# Patient Record
Sex: Male | Born: 1960 | Race: Asian | Hispanic: No | Marital: Married | Smoking: Never smoker
Health system: Southern US, Community
[De-identification: ages and names within clinical notes are randomized; demographics above are authoritative.]

## PROBLEM LIST (undated history)

## (undated) DIAGNOSIS — I10 Essential (primary) hypertension: Secondary | ICD-10-CM

## (undated) DIAGNOSIS — K219 Gastro-esophageal reflux disease without esophagitis: Secondary | ICD-10-CM

## (undated) DIAGNOSIS — E7800 Pure hypercholesterolemia, unspecified: Secondary | ICD-10-CM

---

## 2018-06-21 ENCOUNTER — Ambulatory Visit (HOSPITAL_COMMUNITY)
Admission: EM | Admit: 2018-06-21 | Discharge: 2018-06-21 | Disposition: A | Discharge status: Home / Self Care | Payer: Self-pay | Attending: Emergency Medicine | Admitting: Emergency Medicine

## 2018-06-21 ENCOUNTER — Other Ambulatory Visit: Payer: Self-pay

## 2018-06-21 ENCOUNTER — Encounter (HOSPITAL_COMMUNITY): Payer: Self-pay | Admitting: Emergency Medicine

## 2018-06-21 DIAGNOSIS — R519 Headache, unspecified: Secondary | ICD-10-CM

## 2018-06-21 DIAGNOSIS — R51 Headache: Secondary | ICD-10-CM

## 2018-06-21 MED ORDER — KETOROLAC TROMETHAMINE 60 MG/2ML IM SOLN
60.0000 mg | Freq: Once | INTRAMUSCULAR | Status: AC
  Administered 2018-06-21: 12:00:00 60 mg via INTRAMUSCULAR

## 2018-06-21 MED ORDER — KETOROLAC TROMETHAMINE 60 MG/2ML IM SOLN
INTRAMUSCULAR | Status: AC
  Filled 2018-06-21: qty 2

## 2018-06-21 NOTE — ED Triage Notes (Signed)
Pt presents to [facility] for assessment of 2 months of headache, frontal, and new cough starting yesterday.

## 2018-06-21 NOTE — Discharge Instructions (Addendum)
Your headache is likely a muscular type headache. Based on your history and physical exam, there is no worrisome sign of any severe cause for your headache. Please read attached instruction sheet on headaches. As Tylenol has worked for your headaches in the past, it is okay to use Tylenol, 2 every 6-8 hours if needed for headache, but you need to still follow-up with PCP to reevaluate your chronic headaches. Today in urgent care, we gave you a shot of Toradol which is a strong pain medicine.  This should help your headache. If any severely worsening symptoms, you need to go to emergency room.

## 2018-06-21 NOTE — ED Provider Notes (Addendum)
[facility]    CSN: [id number] Arrival date & time: 06/21/18  1106     History   Chief Complaint Chief Complaint  Patient presents with  . Headache  His primary language is [place]. Interpreter here  HPI Jonathan Prince is a 58 y.o. male.   HPI His symptoms are vague, and a great deal of time was spent taking history and assessing patient. His only complaint is intermittent mid-frontal headache for the past 2 months.  Recalls no injury.  The headache can come and go and sometimes be mild or moderate or severe.  May resolve for several days and then return.  No inciting  cause that he knows of.  He will take Tylenol and the headache resolves. This headache started yesterday, mid frontal, hurts to touch.  Described it as severe yesterday.  Took Tylenol and that decrease the pain somewhat.  Currently headache is 5 out of 10.  This is not the worst headache of his life. No associated weakness or numbness or fever or chills or nausea or vomiting. Denies focal neurologic symptoms.  Denies vision change. I questioned him several times but he denies any respiratory symptoms.  No cough or chest congestion or chest pain or shortness of breath.  No syncope. Denies fever or chills or myalgias. No new rash. No recent foreign travel.  Carefully reviewed past medical history and he denies any past medical problems.  Denies taking any medications other than Tylenol as needed. Denies any drug allergies.  History reviewed. No pertinent past medical history. He denies any past medical history for chronic disease.  Denies history of diabetes or neurologic disease or cardiorespiratory disease. There are no active problems to display for this patient.   History reviewed. No pertinent surgical history.  He denies any surgical history.   Home Medications    Prior to Admission medications   Not on File   He denies any rx medications.    Family History History reviewed. No pertinent  family history.  Social History Social History   Tobacco Use  . Smoking status: Never Smoker  . Smokeless tobacco: Never Used  Substance Use Topics  . Alcohol use: Never    Frequency: Never  . Drug use: Never     Allergies   Patient has no known allergies.   Review of Systems Review of Systems  Constitutional: Negative for chills, diaphoresis, fatigue and fever.  HENT: Negative for congestion, rhinorrhea and sore throat.   Eyes: Negative.  Negative for photophobia and visual disturbance.  Respiratory: Negative for cough, chest tightness and shortness of breath.   Cardiovascular: Negative for chest pain, palpitations and leg swelling.  Gastrointestinal: Negative for abdominal pain, diarrhea, nausea and vomiting.  Genitourinary: Negative.   Musculoskeletal: Negative.  Negative for neck stiffness.  Skin: Negative for rash.  Neurological: Positive for headaches. Negative for dizziness, tremors, seizures, syncope, speech difficulty, weakness, light-headedness and numbness.  Hematological: Negative for adenopathy.  Psychiatric/Behavioral: Negative for confusion and hallucinations.  All other systems reviewed and are negative.  Pertinent items noted in HPI and remainder of comprehensive ROS otherwise negative.    Physical Exam Triage Vital Signs ED Triage Vitals  Enc Vitals Group     BP 06/21/18 1130 (!) 148/90     Pulse Rate 06/21/18 1130 86     Resp 06/21/18 1130 16     Temp 06/21/18 1130 97.6 F (36.4 C)     Temp Source 06/21/18 1130 Temporal     SpO2 06/21/18  1130 97 %     Weight --      Height --      Head Circumference --      Peak Flow --      Pain Score 06/21/18 1127 5     Pain Loc --      Pain Edu? --      Excl. in GC? --    No data found.  Updated Vital Signs BP (!) 148/90 (BP Location: Left Arm)   Pulse 86   Temp 97.6 F (36.4 C) (Temporal)   Resp 16   SpO2 97%   Visual Acuity Right Eye Distance:   Left Eye Distance:   Bilateral Distance:     Right Eye Near:  Grossly intact Left Eye Near:   Grossly intact Bilateral Near:   Grossly intact   Physical Exam Vitals signs reviewed.  Constitutional:      General: He is not in acute distress.    Appearance: He is well-developed. He is not ill-appearing, toxic-appearing or diaphoretic.     Comments: No acute distress  HENT:     Head: Normocephalic and atraumatic. No contusion.     Right Ear: Hearing, tympanic membrane and ear canal normal.     Left Ear: Hearing, tympanic membrane and ear canal normal.     Nose: Nose normal.     Mouth/Throat:     Mouth: Mucous membranes are moist.  Eyes:     General: No scleral icterus.    Extraocular Movements: Extraocular movements intact.     Right eye: Normal extraocular motion and no nystagmus.     Left eye: Normal extraocular motion and no nystagmus.     Conjunctiva/sclera: Conjunctivae normal.     Pupils: Pupils are equal, round, and reactive to light.     Comments: Fundi within normal limits bilaterally.  Discs sharp.  No hemorrhages or exudates.  No signs of papilledema. Visual fields grossly intact bilaterally. Near vision grossly intact each eye and bilaterally  Neck:     Musculoskeletal: Full passive range of motion without pain, normal range of motion and neck supple.     Meningeal: Brudzinski's sign and Kernig's sign absent.  Cardiovascular:     Rate and Rhythm: Normal rate and regular rhythm.  Pulmonary:     Effort: Pulmonary effort is normal. No respiratory distress.     Breath sounds: Normal breath sounds.  Abdominal:     General: There is no distension.     Palpations: Abdomen is soft.  Musculoskeletal:        General: No swelling or tenderness.  Skin:    General: Skin is warm and dry.     Capillary Refill: Capillary refill takes less than 2 seconds.     Findings: No rash.  Neurological:     Mental Status: He is alert and oriented to person, place, and time.     GCS: GCS eye subscore is 4. GCS verbal subscore is  5. GCS motor subscore is 6.     Cranial Nerves: No cranial nerve deficit or facial asymmetry.     Sensory: No sensory deficit.     Gait: Gait normal.     Deep Tendon Reflexes: Reflexes are normal and symmetric.  Psychiatric:        Speech: Speech normal.        Behavior: Behavior normal.        Thought Content: Thought content normal.      UC Treatments / Results  Labs (all  labs ordered are listed, but only abnormal results are displayed) Labs Reviewed - No data to display  EKG None  Radiology No results found.  Procedures Procedures (including critical care time)  Medications Ordered in UC Medications  ketorolac (TORADOL) injection 60 mg (60 mg Intramuscular Given 06/21/18 1218)    Initial Impression / Assessment and Plan / UC Course  I have reviewed the triage vital signs and the nursing notes.  Pertinent labs & imaging results that were available during my care of the patient were reviewed by me and considered in my medical decision making (see chart for details).  Clinical Course as of Jun 20 2299  Tue Jun 21, 2018  1212 Physical exam within normal limits.  No focal neurologic symptoms.  No evidence of any acute neurologic event.  After risk-benefit alternatives discussed, will give Toradol 60 mg IM stat   [DM]    Clinical Course User Index [DM] Lajean Manes, MD   Clinically, no evidence of acute intracranial abnormality.  Physical exam within normal limits. No clinical evidence of infection.  Likely has muscle contraction type headache. Final Clinical Impressions(s) / UC Diagnoses   Final diagnoses:  Frontal headache  Treatment options discussed, as well as risks, benefits, alternatives. Patient voiced understanding and agreement with the following plans:    Discharge Instructions     Your headache is likely a muscular type headache. Based on your history and physical exam, there is no worrisome sign of any severe cause for your headache. Please read  attached instruction sheet on headaches. As Tylenol has worked for your headaches in the past, it is okay to use Tylenol, 2 every 6-8 hours if needed for headache, but you need to still follow-up with PCP to reevaluate your chronic headaches. Today in urgent care, we gave you a shot of Toradol which is a strong pain medicine.  This should help your headache. If any severely worsening symptoms, you need to go to emergency room.     About 15 minutes after Toradol shot, he was reevaluated, and headache significantly improved.  ED Prescriptions    None     Controlled Substance Prescriptions [place] Controlled Substance Registry consulted? Not Applicable   Follow-up with your primary care doctor in 5-7 days if not improving, or sooner if symptoms become worse. Precautions discussed through interpreter Red flags discussed.-Emergency room if any red flag Questions invited and answered through interpreter. Patient voiced understanding and agreement.   Over 45 minutes spent, greater than 50% of the time spent for counseling and coordination of care.  Lajean Manes, MD 06/21/18 5284    Lajean Manes, MD 06/21/18 2306

## 2018-06-23 ENCOUNTER — Emergency Department (HOSPITAL_COMMUNITY)
Admission: EM | Admit: 2018-06-23 | Discharge: 2018-06-23 | Disposition: A | Discharge status: Home / Self Care | Payer: HRSA Program | Attending: Emergency Medicine | Admitting: Emergency Medicine

## 2018-06-23 ENCOUNTER — Emergency Department (HOSPITAL_COMMUNITY): Payer: HRSA Program

## 2018-06-23 ENCOUNTER — Encounter (HOSPITAL_COMMUNITY): Payer: Self-pay

## 2018-06-23 ENCOUNTER — Other Ambulatory Visit: Payer: Self-pay

## 2018-06-23 DIAGNOSIS — U071 COVID-19: Secondary | ICD-10-CM

## 2018-06-23 DIAGNOSIS — I10 Essential (primary) hypertension: Secondary | ICD-10-CM

## 2018-06-23 DIAGNOSIS — R5383 Other fatigue: Secondary | ICD-10-CM | POA: Diagnosis not present

## 2018-06-23 DIAGNOSIS — R05 Cough: Secondary | ICD-10-CM | POA: Diagnosis present

## 2018-06-23 LAB — SARS CORONAVIRUS 2 BY RT PCR (HOSPITAL ORDER, PERFORMED IN ~~LOC~~ HOSPITAL LAB): SARS Coronavirus 2: POSITIVE — AB

## 2018-06-23 MED ORDER — BENZONATATE 100 MG PO CAPS: 100.0000 mg | ORAL_CAPSULE | Freq: Three times a day (TID) | ORAL | 0 refills | Status: DC

## 2018-06-23 MED ORDER — HYDROCHLOROTHIAZIDE 25 MG PO TABS: 25.0000 mg | ORAL_TABLET | Freq: Every day | ORAL | 0 refills | Status: DC

## 2018-06-23 NOTE — ED Provider Notes (Addendum)
[facility] Provider Note   CSN: [id number] Arrival date & time: 06/23/18  1556    History   Chief Complaint Chief Complaint  Patient presents with  . Cough  . Fatigue    HPI Jonathan Prince is a 58 y.o. male.     The history is provided by the patient and a relative. The history is limited by a language barrier. A language interpreter was used.  Cough     58 year old [place] male presenting with covid-19 like symptoms.  Patient is able to speak in [place] for which I was able to communicate with.  History obtained through patient and through son who is at bedside.  Patient report for the past 10 days he has not been feeling well.  He has decreased appetite, recurrent frontal headache, congestion, nonproductive cough, throat irritation, decreased energy level, feeling fatigue and having subjective fever.  Her symptom has been waxing waning and has become progressively worse.  He was seen in the ED several days ago for the same complaint.  Subsequently discharged home and went to work but he felt that he is too tired to work.  He did admits to prior interaction with a family member who test positive for COVID-19.  He denies any recent travel.  He denies any significant shortness of breath or wheezing.  Does not complain of any nausea or vomiting or diarrhea chest pain or abdominal pain.  He does have history of hypertension currently not on any medication.  He has been taking Tylenol at home for his headache without adequate relief.  History reviewed. No pertinent past medical history.  There are no active problems to display for this patient.   History reviewed. No pertinent surgical history.      Home Medications    Prior to Admission medications   Not on File    Family History No family history on file.  Social History Social History   Tobacco Use  . Smoking status: Never Smoker  . Smokeless tobacco: Never Used  Substance Use  Topics  . Alcohol use: Never    Frequency: Never  . Drug use: Never     Allergies   Patient has no known allergies.   Review of Systems Review of Systems  Respiratory: Positive for cough.   All other systems reviewed and are negative.    Physical Exam Updated Vital Signs BP (!) 177/94   Pulse 78   Temp 97.6 F (36.4 C) (Oral)   Resp 16   Wt 72.6 kg   SpO2 99%   Physical Exam Vitals signs and nursing note reviewed.  Constitutional:      General: He is not in acute distress.    Appearance: He is well-developed.  HENT:     Head: Atraumatic.     Nose: Nose normal.     Mouth/Throat:     Mouth: Mucous membranes are moist.  Eyes:     Conjunctiva/sclera: Conjunctivae normal.  Neck:     Musculoskeletal: Normal range of motion and neck supple.  Cardiovascular:     Rate and Rhythm: Normal rate and regular rhythm.     Pulses: Normal pulses.     Heart sounds: Normal heart sounds.  Pulmonary:     Effort: Pulmonary effort is normal.     Breath sounds: Normal breath sounds. No wheezing, rhonchi or rales.  Abdominal:     Palpations: Abdomen is soft.     Tenderness: There is no abdominal tenderness.  Musculoskeletal: Normal  range of motion.     Comments: 5 out of 5 strength to all 4 extremities  Lymphadenopathy:     Cervical: No cervical adenopathy.  Skin:    Findings: No rash.  Neurological:     General: No focal deficit present.     Mental Status: He is alert.  Psychiatric:        Mood and Affect: Mood normal.      ED Treatments / Results  Labs (all labs ordered are listed, but only abnormal results are displayed) Labs Reviewed  SARS CORONAVIRUS 2 (HOSPITAL ORDER, PERFORMED IN [facility]) - Abnormal; Notable for the following components:      Result Value   SARS Coronavirus 2 POSITIVE (*)    All other components within normal limits    EKG None  Radiology Dg Chest Portable 1 View  Result Date: 06/23/2018 CLINICAL DATA:  58 year old male  with fever and cough. EXAM: PORTABLE CHEST 1 VIEW COMPARISON:  None. FINDINGS: The lungs are clear. There is no pleural effusion or pneumothorax. The cardiac silhouette is within normal limits. No acute osseous pathology. IMPRESSION: No active disease. Electronically Signed   By: Elgie Collard M.D.   On: 06/23/2018 19:10    Procedures Procedures (including critical care time)  Medications Ordered in ED Medications - No data to display   Initial Impression / Assessment and Plan / ED Course  I have reviewed the triage vital signs and the nursing notes.  Pertinent labs & imaging results that were available during my care of the patient were reviewed by me and considered in my medical decision making (see chart for details).  Clinical Course as of Jul 26 800  Tue Jul 26, 2018  1824 SARS Coronavirus 2(!): POSITIVE [DM]    Clinical Course User Index [DM] Jonathan Manes, MD       BP (!) 171/92   Pulse 76   Temp 98.3 F (36.8 C) (Oral)   Resp 19   Wt 72.6 kg   SpO2 98%    Final Clinical Impressions(s) / ED Diagnoses   Final diagnoses:  COVID-19 virus detected  Essential hypertension    ED Discharge Orders         Ordered    hydrochlorothiazide (HYDRODIURIL) 25 MG tablet  Daily     06/23/18 2228    benzonatate (TESSALON) 100 MG capsule  Every 8 hours     06/23/18 2228         Jonathan Prince was evaluated in Emergency Department on 06/23/2018 for the symptoms described in the history of present illness. He was evaluated in the context of the global COVID-19 pandemic, which necessitated consideration that the patient might be at risk for infection with the SARS-CoV-2 virus that causes COVID-19. Institutional protocols and algorithms that pertain to the evaluation of patients at risk for COVID-19 are in a state of rapid change based on information released by regulatory bodies including the CDC and federal and state organizations. These policies and algorithms were followed during  the patient's care in the ED.  Patient with symptoms suggestive of COVID-19.  He is otherwise well-appearing in no acute respiratory discomfort.  O2 sats at 99% on room air.  He is afebrile.  He is found to be hypertensive with blood pressure 177/94.  History of hypertension not on any blood pressure medication.  Will obtain COVID test.  Anticipate discharge with appropriate work note.  10:23 PM Chest x-ray at this time shows no acute finding however  his COVID-19 test is positive.  At this time patient does not require to be admitted to the hospital.  No hypoxia, he is well-appearing, his vital sign is very stable.  Will discharge home with symptomatic treatment which includes cough medication.  I will also start patient on some blood pressure medication as well.  Work note provided.  Appropriate self quarantine instruction given.  Return precaution discussed.  Patient voiced understanding and agrees with plan.   Fayrene Helperran, Kamaya Keckler, PA-C 06/23/18 2259    Pricilla LovelessGoldston, Scott, MD 06/30/18 0840    Fayrene Helperran, Calene Paradiso, PA-C 07/27/18 [id number]    Pricilla LovelessGoldston, Scott, MD 07/30/18 [phone]

## 2018-06-23 NOTE — ED Notes (Signed)
Date and time results received: 06/23/18 2205 (use smartphrase ".now" to insert current time)  Test: COVID-19 Critical Value: positive  Name of Provider Notified: Kelton Pillar  Orders Received? Or Actions Taken?: Orders Received - See Orders for details

## 2018-06-23 NOTE — ED Notes (Signed)
Pt and friend given and verbalized understanding of discharge instructions and need for follow up with pcp and quarantine at home. Told to return if s/s worsen. No further distress or questions at time of ambulation out of department

## 2018-06-23 NOTE — ED Notes (Signed)
Pt resting in bed. Swab done and walked to lab. Monitors applied and VSS. Greta Doom, PA at the bedside. Pt in NAD

## 2018-06-23 NOTE — ED Triage Notes (Addendum)
Pt arrives with housemate who provides translation. Pt was seen at [facility] on 6/2 and received meds. Pt has been coughing "a lot", has been fatigued, and has headache. Pt tried working yesterday, and was unable to. He also endorses a fever, although he does not have a thermometer. Pt also endorses sore throat.

## 2018-07-16 ENCOUNTER — Other Ambulatory Visit: Payer: Self-pay | Admitting: *Deleted

## 2018-07-16 DIAGNOSIS — Z20822 Contact with and (suspected) exposure to covid-19: Secondary | ICD-10-CM

## 2018-07-22 LAB — NOVEL CORONAVIRUS, NAA: SARS-CoV-2, NAA: NOT DETECTED

## 2018-07-25 ENCOUNTER — Telehealth: Payer: Self-pay | Admitting: *Deleted

## 2018-07-25 DIAGNOSIS — Z20822 Contact with and (suspected) exposure to covid-19: Secondary | ICD-10-CM

## 2018-07-25 NOTE — Telephone Encounter (Signed)
-----   Message from Elsie Stain, MD sent at 07/25/2018 11:57 AM EDT ----- Regarding: needs  repeat COVID test , needs a second negative test in order to return to work Needs COVID test this week   Call his son Quinn Plowman [phone] as pt is [place] and does not speak english  No insurance  DOB 2060-02-10

## 2018-07-25 NOTE — Telephone Encounter (Signed)
Spoke with patient's son Grayland Ormond, scheduled patient for COVID 19 test tomorrow at [facility] at 9:45 am.  Testing protocol reviewed with patient's son.

## 2018-07-26 ENCOUNTER — Other Ambulatory Visit: Payer: Self-pay

## 2018-07-26 DIAGNOSIS — Z20822 Contact with and (suspected) exposure to covid-19: Secondary | ICD-10-CM

## 2018-07-30 LAB — NOVEL CORONAVIRUS, NAA: SARS-CoV-2, NAA: NOT DETECTED

## 2019-04-15 ENCOUNTER — Ambulatory Visit: Payer: Self-pay | Attending: Internal Medicine

## 2019-04-15 DIAGNOSIS — Z23 Encounter for immunization: Secondary | ICD-10-CM

## 2019-04-15 NOTE — Progress Notes (Signed)
   Covid-19 Vaccination Clinic  Name:  Jonathan Prince    MRN: [id number] DOB: 11/14/1960  04/15/2019  [name] was observed post Covid-19 immunization for 15 minutes without incident. He was provided with Vaccine Information Sheet and instruction to access the V-Safe system.   [name] was instructed to call 911 with any severe reactions post vaccine: Marland Kitchen Difficulty breathing  . Swelling of face and throat  . A fast heartbeat  . A bad rash all over body  . Dizziness and weakness

## 2019-05-10 ENCOUNTER — Encounter (HOSPITAL_COMMUNITY): Payer: Self-pay

## 2019-05-10 ENCOUNTER — Ambulatory Visit (HOSPITAL_COMMUNITY)
Admission: EM | Admit: 2019-05-10 | Discharge: 2019-05-10 | Disposition: A | Discharge status: Home / Self Care | Payer: 59 | Attending: Emergency Medicine | Admitting: Emergency Medicine

## 2019-05-10 ENCOUNTER — Other Ambulatory Visit: Payer: Self-pay

## 2019-05-10 DIAGNOSIS — K219 Gastro-esophageal reflux disease without esophagitis: Secondary | ICD-10-CM | POA: Diagnosis present

## 2019-05-10 LAB — COMPREHENSIVE METABOLIC PANEL
ALT: 20 U/L (ref 0–44)
AST: 18 U/L (ref 15–41)
Albumin: 4 g/dL (ref 3.5–5.0)
Alkaline Phosphatase: 68 U/L (ref 38–126)
Anion gap: 8 (ref 5–15)
BUN: 7 mg/dL (ref 6–20)
CO2: 23 mmol/L (ref 22–32)
Calcium: 8.7 mg/dL — ABNORMAL LOW (ref 8.9–10.3)
Chloride: 106 mmol/L (ref 98–111)
Creatinine, Ser: 0.73 mg/dL (ref 0.61–1.24)
GFR calc Af Amer: >60 mL/min (ref >60)
GFR calc non Af Amer: >60 mL/min (ref >60)
Glucose, Bld: 75 mg/dL (ref 70–99)
Potassium: 3.9 mmol/L (ref 3.5–5.1)
Sodium: 137 mmol/L (ref 135–145)
Total Bilirubin: 0.9 mg/dL (ref 0.3–1.2)
Total Protein: 7.9 g/dL (ref 6.5–8.1)

## 2019-05-10 LAB — CBC
HCT: 47.4 % (ref 39.0–52.0)
Hemoglobin: 14.6 g/dL (ref 13.0–17.0)
MCH: 22.5 pg — ABNORMAL LOW (ref 26.0–34.0)
MCHC: 30.8 g/dL (ref 30.0–36.0)
MCV: 73.1 fL — ABNORMAL LOW (ref 80.0–100.0)
Platelets: 310 10*3/uL (ref 150–400)
RBC: 6.48 MIL/uL — ABNORMAL HIGH (ref 4.22–5.81)
RDW: 14.2 % (ref 11.5–15.5)
WBC: 7.7 10*3/uL (ref 4.0–10.5)
nRBC: 0 % (ref 0.0–0.2)

## 2019-05-10 MED ORDER — OMEPRAZOLE 20 MG PO CPDR: 20.0000 mg | DELAYED_RELEASE_CAPSULE | Freq: Two times a day (BID) | ORAL | 0 refills | Status: DC

## 2019-05-10 NOTE — Discharge Instructions (Addendum)
Starting you on a medication to help with your stomach and acid reflux Take the medication twice a day before meals preferably once in the morning and once before dinner.  You want to take this medication 30 to 60 minutes before a meal with a full glass of water Avoid spicy, greasy, fried foods, caffeine because this can be irritating to the stomach. Would also like for you to keep monitoring your blood pressure.  Decreased sodium or salt intake and increase water intake for rehydration. If you may be mildly dehydrated We are checking some blood work and I will call with any abnormal results. Contact given for follow-up with primary care for further management

## 2019-05-11 NOTE — ED Provider Notes (Signed)
[facility]    CSN: [id number] Arrival date & time: 05/10/19  2831      History   Chief Complaint Chief Complaint  Patient presents with  . Diarrhea  . Chest Pain    HPI Jonathan Prince is a 59 y.o. male.   Patient is a 59 year old man the presents today with epigastric pain, diarrhea.  This has been present, waxing waning over the past few days.  Has not taken any medication for his symptoms.  Reporting worse after eating and slight nausea.  Diarrhea is mostly watery. no vomiting.  No fever, body aches, chills.  No chest pain or shortness of breath.  Denies any bloody stools. No recent traveling. No hx of h pylori.  Does not smoke or drink any alcohol.  ROS per HPI      History reviewed. No pertinent past medical history.  There are no problems to display for this patient.   History reviewed. No pertinent surgical history.     Home Medications    Prior to Admission medications   Medication Sig Start Date End Date Taking? Authorizing Provider  acetaminophen (TYLENOL) 325 MG tablet Take 650 mg by mouth every 6 (six) hours as needed for fever or headache.    [provider]  omeprazole (PRILOSEC) 20 MG capsule Take 1 capsule (20 mg total) by mouth 2 (two) times daily before a meal. 05/10/19 06/09/19  Junious Ragone A, NP  hydrochlorothiazide (HYDRODIURIL) 25 MG tablet Take 1 tablet (25 mg total) by mouth daily. 06/23/18 05/10/19  Domenic Moras, PA-C    Family History History reviewed. No pertinent family history.  Social History Social History   Tobacco Use  . Smoking status: Never Smoker  . Smokeless tobacco: Never Used  Substance Use Topics  . Alcohol use: Never  . Drug use: Never     Allergies   Patient has no known allergies.   Review of Systems Review of Systems   Physical Exam Triage Vital Signs ED Triage Vitals  Enc Vitals Group     BP 05/10/19 0932 (!) 166/93     Pulse Rate 05/10/19 0932 61     Resp 05/10/19 0932 14     Temp  05/10/19 0932 98.2 F (36.8 C)     Temp Source 05/10/19 0932 Oral     SpO2 05/10/19 0932 99 %     Weight --      Height --      Head Circumference --      Peak Flow --      Pain Score 05/10/19 0930 7     Pain Loc --      Pain Edu? --      Excl. in [place]? --    No data found.  Updated Vital Signs BP (!) 160/87 (BP Location: Right Arm)   Pulse 63   Temp 98.6 F (37 C) (Oral)   Resp 18   SpO2 99%   Visual Acuity Right Eye Distance:   Left Eye Distance:   Bilateral Distance:    Right Eye Near:   Left Eye Near:    Bilateral Near:     Physical Exam Abdominal:     General: Abdomen is flat. Bowel sounds are normal. There is no distension.     Palpations: Abdomen is soft.     Tenderness: There is abdominal tenderness in the epigastric area. There is no right CVA tenderness, left CVA tenderness, guarding or rebound. Negative signs include Murphy's sign.  UC Treatments / Results  Labs (all labs ordered are listed, but only abnormal results are displayed) Labs Reviewed  CBC - Abnormal; Notable for the following components:      Result Value   RBC 6.48 (*)    MCV 73.1 (*)    MCH 22.5 (*)    All other components within normal limits  COMPREHENSIVE METABOLIC PANEL - Abnormal; Notable for the following components:   Calcium 8.7 (*)    All other components within normal limits    EKG   Radiology No results found.  Procedures ED EKG  Date/Time: 05/11/2019 2:27 PM Performed by: Janace Aris, NP Authorized by: Wieters, Bethel C, PA-C   Interpretation:    Interpretation: normal   Rate:    ECG rate assessment: normal   Rhythm:    Rhythm: sinus rhythm   Ectopy:    Ectopy: none   ST segments:    ST segments:  Non-specific Other findings:    Other findings: LVH     (including critical care time)  Medications Ordered in UC Medications - No data to display  Initial Impression / Assessment and Plan / UC Course  I have reviewed the triage vital signs  and the nursing notes.  Pertinent labs & imaging results that were available during my care of the patient were reviewed by me and considered in my medical decision making (see chart for details).     GERD Treating with omeprazole Diet precautions given Instructions given on how to take the medicine.  Blood work here today unremarkable. EKG not concerning,.  Does not appear to be dehydrated from the diarrhea. No electrolyte imbalance Vital signs stable he is nontoxic or ill-appearing. Recommended decrease sodium intake and keep monitoring blood pressures for elevated blood pressure reading Contact given for primary care follow-up for further management of blood pressure Final Clinical Impressions(s) / UC Diagnoses   Final diagnoses:  Gastroesophageal reflux disease, unspecified whether esophagitis present     Discharge Instructions     Starting you on a medication to help with your stomach and acid reflux Take the medication twice a day before meals preferably once in the morning and once before dinner.  You want to take this medication 30 to 60 minutes before a meal with a full glass of water Avoid spicy, greasy, fried foods, caffeine because this can be irritating to the stomach. Would also like for you to keep monitoring your blood pressure.  Decreased sodium or salt intake and increase water intake for rehydration. If you may be mildly dehydrated We are checking some blood work and I will call with any abnormal results. Contact given for follow-up with primary care for further management    ED Prescriptions    Medication Sig Dispense Auth. Provider   omeprazole (PRILOSEC) 20 MG capsule Take 1 capsule (20 mg total) by mouth 2 (two) times daily before a meal. 60 capsule Ustin Cruickshank A, NP     PDMP not reviewed this encounter.   Dahlia Byes A, NP 05/11/19 1428

## 2019-10-22 NOTE — Progress Notes (Signed)
    SUBJECTIVE:   CHIEF COMPLAINT / HPI:  Patient has not had a doctor prior to this.  He has lived in [place] for 3 years.  Prior to that he lived in [place].  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  He does not have any knee back or shoulder pain from this work.  Patient lives with his wife and son.  Hypertension: Patient states he was diagnosed on his initial arrival to [place] and given a blood pressure medication, but he has not been taking it.  Dry skin on hand: Patient has itching of the fingers and palm, specifically on the right hand.  He believes it is from the chemicals he is exposed to at work.  He wears gloves while working.  Pruritus does not extend beyond his hands.  PERTINENT  PMH / PSH: No prior surgeries.  No allergies.  Patient has not smoked or drank alcohol in over 25 years  OBJECTIVE:   BP (!) 168/84   Pulse 83   Ht 5\' 3"  (1.6 m)   Wt 132 lb 6.4 oz (60.1 kg)   SpO2 97%   BMI 23.45 kg/m   General: Alert elderly thin male.  Appears older than stated age but active.  Son and interpreter are accompanying him. HEENT: EOMI, PERRLA.  Moist oral mucosa. CV: Regular rhythm, no murmurs Pulmonary: Lungs clear to auscultation bilaterally Abdomen: Soft, nontender.  Normal bowel sounds Skin: Dry cracked skin on the dorsal aspect of the hands and fingers.  Thickened palmar surface, but no lesions or vesicles.  ASSESSMENT/PLAN:   Hypertension Diagnosed approximately 3 years ago.  Not taking medications.  Multiple past values in health record above 160 systolic.  We will start losartan today.  Get BMP.  Follow-up in 1 to 2 weeks for recheck of BMP and blood pressure.  Healthcare maintenance We will get A1c and lipid panel today  Contact dermatitis and eczema Patient has dry cracked skin on the hands and states he is exposed to unknown chemicals on his hands at work even though he wears gloves.  Pruritus may be  due to contact dermatitis or a dyshidrotic eczema.  Advised patient to use Vaseline or other ointment on the hands multiple times a day and prescribed hydrocortisone 2.5 ointment for up pruritus episodes but advised him not to use this daily.     , MD [facility]

## 2019-10-23 ENCOUNTER — Encounter: Payer: Self-pay | Admitting: Family Medicine

## 2019-10-23 ENCOUNTER — Other Ambulatory Visit: Payer: Self-pay

## 2019-10-23 ENCOUNTER — Ambulatory Visit (INDEPENDENT_AMBULATORY_CARE_PROVIDER_SITE_OTHER): Payer: BC Managed Care – PPO | Admitting: Family Medicine

## 2019-10-23 VITALS — BP 168/84 | HR 83 | Ht 63.0 in | Wt 132.4 lb

## 2019-10-23 DIAGNOSIS — I10 Essential (primary) hypertension: Secondary | ICD-10-CM

## 2019-10-23 DIAGNOSIS — L259 Unspecified contact dermatitis, unspecified cause: Secondary | ICD-10-CM | POA: Diagnosis not present

## 2019-10-23 DIAGNOSIS — Z0000 Encounter for general adult medical examination without abnormal findings: Secondary | ICD-10-CM | POA: Diagnosis not present

## 2019-10-23 LAB — POCT GLYCOSYLATED HEMOGLOBIN (HGB A1C): Hemoglobin A1C: 4.7 % (ref 4.0–5.6)

## 2019-10-23 MED ORDER — LOSARTAN POTASSIUM 25 MG PO TABS: 25.0000 mg | ORAL_TABLET | Freq: Every day | ORAL | 3 refills | Status: DC

## 2019-10-23 MED ORDER — HYDROCORTISONE 2.5 % EX OINT: TOPICAL_OINTMENT | Freq: Two times a day (BID) | CUTANEOUS | 3 refills | Status: DC

## 2019-10-23 NOTE — Assessment & Plan Note (Signed)
Patient has dry cracked skin on the hands and states he is exposed to unknown chemicals on his hands at work even though he wears gloves.  Pruritus may be due to contact dermatitis or a dyshidrotic eczema.  Advised patient to use Vaseline or other ointment on the hands multiple times a day and prescribed hydrocortisone 2.5 ointment for up pruritus episodes but advised him not to use this daily.

## 2019-10-23 NOTE — Patient Instructions (Signed)
It was nice to meet you today,  I have prescribed a blood pressure medicine for you.  I want you to take it once a day at night before bed.  If you have any dizziness, swelling of the face, or cough let us know right away.  For your blood pressure, you should avoid foods with a lot of salt or sodium in them.  Otherwise you should eat a well-balanced diet that includes a lot of vegetables, grains and lean meats like chicken or fish.  I have prescribed you a steroid ointment for your dry skin when it itches.  I would like you to also put on Vaseline or similar ointment multiple times a day to keep your skin from drying out.  I would like to see you back in 10 to 14 days to recheck your blood pressure and kidney function.  Have a great day,  Frederic Jericho, MD

## 2019-10-23 NOTE — Assessment & Plan Note (Signed)
Diagnosed approximately 3 years ago.  Not taking medications.  Multiple past values in health record above 160 systolic.  We will start losartan today.  Get BMP.  Follow-up in 1 to 2 weeks for recheck of BMP and blood pressure.

## 2019-10-23 NOTE — Assessment & Plan Note (Signed)
We will get A1c and lipid panel today

## 2019-10-24 LAB — LIPID PANEL
Chol/HDL Ratio: 4.4 ratio (ref 0.0–5.0)
Cholesterol, Total: 179 mg/dL (ref 100–199)
HDL: 41 mg/dL (ref >39)
LDL Chol Calc (NIH): 107 mg/dL — ABNORMAL HIGH (ref 0–99)
Triglycerides: 174 mg/dL — ABNORMAL HIGH (ref 0–149)
VLDL Cholesterol Cal: 31 mg/dL (ref 5–40)

## 2019-10-24 LAB — BASIC METABOLIC PANEL
BUN/Creatinine Ratio: 16 (ref 10–24)
BUN: 15 mg/dL (ref 8–27)
CO2: 21 mmol/L (ref 20–29)
Calcium: 9.5 mg/dL (ref 8.6–10.2)
Chloride: 104 mmol/L (ref 96–106)
Creatinine, Ser: 0.91 mg/dL (ref 0.76–1.27)
GFR calc Af Amer: 105 mL/min/{1.73_m2} (ref >59)
GFR calc non Af Amer: 91 mL/min/{1.73_m2} (ref >59)
Glucose: 87 mg/dL (ref 65–99)
Potassium: 4.1 mmol/L (ref 3.5–5.2)
Sodium: 140 mmol/L (ref 134–144)

## 2019-12-01 ENCOUNTER — Emergency Department (HOSPITAL_COMMUNITY)
Admission: EM | Admit: 2019-12-01 | Discharge: 2019-12-01 | Disposition: A | Discharge status: Home / Self Care | Payer: BC Managed Care – PPO | Attending: Emergency Medicine | Admitting: Emergency Medicine

## 2019-12-01 ENCOUNTER — Encounter (HOSPITAL_COMMUNITY): Payer: Self-pay | Admitting: Emergency Medicine

## 2019-12-01 ENCOUNTER — Ambulatory Visit (HOSPITAL_COMMUNITY)
Admission: EM | Admit: 2019-12-01 | Discharge: 2019-12-01 | Disposition: A | Discharge status: Home / Self Care | Payer: BC Managed Care – PPO | Attending: Family Medicine | Admitting: Family Medicine

## 2019-12-01 ENCOUNTER — Emergency Department (HOSPITAL_COMMUNITY): Payer: BC Managed Care – PPO

## 2019-12-01 ENCOUNTER — Other Ambulatory Visit: Payer: Self-pay

## 2019-12-01 DIAGNOSIS — I10 Essential (primary) hypertension: Secondary | ICD-10-CM | POA: Diagnosis not present

## 2019-12-01 DIAGNOSIS — R0789 Other chest pain: Secondary | ICD-10-CM | POA: Insufficient documentation

## 2019-12-01 DIAGNOSIS — Z79899 Other long term (current) drug therapy: Secondary | ICD-10-CM | POA: Insufficient documentation

## 2019-12-01 DIAGNOSIS — R519 Headache, unspecified: Secondary | ICD-10-CM

## 2019-12-01 DIAGNOSIS — J90 Pleural effusion, not elsewhere classified: Secondary | ICD-10-CM | POA: Diagnosis not present

## 2019-12-01 DIAGNOSIS — R079 Chest pain, unspecified: Secondary | ICD-10-CM

## 2019-12-01 DIAGNOSIS — Z8673 Personal history of transient ischemic attack (TIA), and cerebral infarction without residual deficits: Secondary | ICD-10-CM | POA: Diagnosis not present

## 2019-12-01 DIAGNOSIS — R42 Dizziness and giddiness: Secondary | ICD-10-CM

## 2019-12-01 DIAGNOSIS — Z20822 Contact with and (suspected) exposure to covid-19: Secondary | ICD-10-CM | POA: Insufficient documentation

## 2019-12-01 DIAGNOSIS — J9811 Atelectasis: Secondary | ICD-10-CM | POA: Diagnosis not present

## 2019-12-01 LAB — BASIC METABOLIC PANEL
Anion gap: 11 (ref 5–15)
BUN: 7 mg/dL — ABNORMAL LOW (ref 8–23)
CO2: 26 mmol/L (ref 22–32)
Calcium: 8.8 mg/dL — ABNORMAL LOW (ref 8.9–10.3)
Chloride: 103 mmol/L (ref 98–111)
Creatinine, Ser: 0.88 mg/dL (ref 0.61–1.24)
GFR, Estimated: >60 mL/min (ref >60)
Glucose, Bld: 67 mg/dL — ABNORMAL LOW (ref 70–99)
Potassium: 3.5 mmol/L (ref 3.5–5.1)
Sodium: 140 mmol/L (ref 135–145)

## 2019-12-01 LAB — CBC
HCT: 47.3 % (ref 39.0–52.0)
Hemoglobin: 14.3 g/dL (ref 13.0–17.0)
MCH: 22.6 pg — ABNORMAL LOW (ref 26.0–34.0)
MCHC: 30.2 g/dL (ref 30.0–36.0)
MCV: 74.6 fL — ABNORMAL LOW (ref 80.0–100.0)
Platelets: 326 10*3/uL (ref 150–400)
RBC: 6.34 MIL/uL — ABNORMAL HIGH (ref 4.22–5.81)
RDW: 14.5 % (ref 11.5–15.5)
WBC: 7.1 10*3/uL (ref 4.0–10.5)
nRBC: 0 % (ref 0.0–0.2)

## 2019-12-01 LAB — TROPONIN I (HIGH SENSITIVITY)
Troponin I (High Sensitivity): 5 ng/L (ref <18)
Troponin I (High Sensitivity): 4 ng/L (ref <18)

## 2019-12-01 LAB — CBG MONITORING, ED: Glucose-Capillary: 70 mg/dL (ref 70–99)

## 2019-12-01 LAB — HEPATIC FUNCTION PANEL
ALT: 17 U/L (ref 0–44)
AST: 20 U/L (ref 15–41)
Albumin: 3.7 g/dL (ref 3.5–5.0)
Alkaline Phosphatase: 61 U/L (ref 38–126)
Bilirubin, Direct: 0.1 mg/dL (ref 0.0–0.2)
Indirect Bilirubin: 0.7 mg/dL (ref 0.3–0.9)
Total Bilirubin: 0.8 mg/dL (ref 0.3–1.2)
Total Protein: 7.7 g/dL (ref 6.5–8.1)

## 2019-12-01 LAB — RESPIRATORY PANEL BY RT PCR (FLU A&B, COVID)
Influenza A by PCR: NEGATIVE
Influenza B by PCR: NEGATIVE
SARS Coronavirus 2 by RT PCR: NEGATIVE

## 2019-12-01 MED ORDER — LOSARTAN POTASSIUM 25 MG PO TABS: 25.0000 mg | ORAL_TABLET | Freq: Every day | ORAL | 0 refills | Status: DC

## 2019-12-01 MED ORDER — LOSARTAN POTASSIUM 50 MG PO TABS
25.0000 mg | ORAL_TABLET | Freq: Once | ORAL | Status: AC
  Administered 2019-12-01: 25 mg via ORAL
  Filled 2019-12-01: qty 1

## 2019-12-01 NOTE — ED Triage Notes (Addendum)
Pt c/o dizziness and significant frontal HA for approx 3 days. Pt states he had similar HA in past. Also c/o intermittent central CP, now 5/10 pain scale, tight in nature.  Also c/o lower right back pain for approx 2-3 months.   Has not taken HTN Rx for approx 1 week 2/2 running out of medication.   Denies n/v/d, SOB, diaphoresis, radiating pain to jaw or upper back, slurred speech, changes in vision, extremity weakness, ear pain/pressure or ringing. Took tylenol yesterday for HA w/o improvement.  Smile symmetrical, PERRLA, no arm drift, grips strong, left slightly weaker than right. EKG performed and given to K. Harris, NP who came in for BS eval and advised pt and interpreter for need to go to ED STAT for cardiac eval, potential imaging of head.

## 2019-12-01 NOTE — ED Provider Notes (Signed)
[facility]    CSN: [id number] Arrival date & time: 12/01/19  0340      History   Chief Complaint Chief Complaint  Patient presents with  . Dizziness  . Chest Pain    HPI Uchenna Saraceni is a 59 y.o. male.   HPI  Patient with limited medical history presents today accompanied by family complaining of midsternal chest pain, dizziness, headache pain.  Patiently recently established with a primary care provider and was placed on antihypertensive which he took until the prescription was complete and never refilled.  Since that time he has had intermittent chest pain, generalized fatigue and dizziness.  He has had no previous cardiovascular work-up.  Pain has been constant and unrelieved with any positional changes or rest.  He denies any shortness of breath.  No past medical history on file.  Patient Active Problem List   Diagnosis Date Noted  . Hypertension 10/23/2019  . Healthcare maintenance 10/23/2019  . Contact dermatitis and eczema 10/23/2019    No past surgical history on file.     Home Medications    Prior to Admission medications   Medication Sig Start Date End Date Taking? Authorizing Provider  acetaminophen (TYLENOL) 325 MG tablet Take 650 mg by mouth every 6 (six) hours as needed for fever or headache. Patient not taking: Reported on 10/23/2019    [provider]  hydrocortisone 2.5 % ointment Apply topically 2 (two) times daily. As needed for mild eczema.  Do not use for more than 1-2 weeks at a time. 10/23/19   Sandre Kitty, MD  losartan (COZAAR) 25 MG tablet Take 1 tablet (25 mg total) by mouth at bedtime. 10/23/19   Sandre Kitty, MD  omeprazole (PRILOSEC) 20 MG capsule Take 1 capsule (20 mg total) by mouth 2 (two) times daily before a meal. Patient not taking: Reported on 10/23/2019 05/10/19 06/09/19  Dahlia Byes A, NP  hydrochlorothiazide (HYDRODIURIL) 25 MG tablet Take 1 tablet (25 mg total) by mouth daily. 06/23/18 05/10/19  Fayrene Helper, PA-C     Family History No family history on file.  Social History Social History   Tobacco Use  . Smoking status: Never Smoker  . Smokeless tobacco: Never Used  Substance Use Topics  . Alcohol use: Never  . Drug use: Never     Allergies   Patient has no known allergies.   Review of Systems Review of Systems Pertinent negatives listed in HPI  Physical Exam Triage Vital Signs ED Triage Vitals [12/01/19 1009]  Enc Vitals Group     BP (!) 171/77     Pulse Rate 73     Resp 17     Temp 97.8 F (36.6 C)     Temp Source Oral     SpO2 100 %     Weight      Height      Head Circumference      Peak Flow      Pain Score 5     Pain Loc      Pain Edu?      Excl. in [place]?    No data found.  Updated Vital Signs BP (!) 171/77 (BP Location: Right Arm)   Pulse 73   Temp 97.8 F (36.6 C) (Oral)   Resp 17   SpO2 100%   Visual Acuity Right Eye Distance:   Left Eye Distance:   Bilateral Distance:    Right Eye Near:   Left Eye Near:  Bilateral Near:     Physical Exam Vitals and nursing note reviewed.  Constitutional:      Appearance: He is not toxic-appearing.  HENT:     Head: Normocephalic.  Cardiovascular:     Rate and Rhythm: Normal rate. Rhythm irregular.  Pulmonary:     Effort: Pulmonary effort is normal.     Breath sounds: Normal breath sounds.  Skin:    General: Skin is warm and dry.     Capillary Refill: Capillary refill takes less than 2 seconds.  Neurological:     Mental Status: He is alert.  Psychiatric:        Mood and Affect: Mood normal.        Behavior: Behavior normal.      UC Treatments / Results  Labs (all labs ordered are listed, but only abnormal results are displayed) Labs Reviewed - No data to display  EKG   Radiology DG Chest 2 View  Result Date: 12/01/2019 CLINICAL DATA:  Chest pain EXAM: CHEST - 2 VIEW COMPARISON:  06/23/2018 chest radiograph. FINDINGS: Hypoinflated lungs with linear bibasilar atelectasis. No pneumothorax  or pleural effusion. Cardiomediastinal silhouette within normal limits. No acute osseous abnormality. IMPRESSION: No focal airspace disease.  Bibasilar atelectasis. Electronically Signed   By: Stana Bunting M.D.   On: 12/01/2019 10:53    Procedures Procedures (including critical care time)  Medications Ordered in UC Medications - No data to display  Initial Impression / Assessment and Plan / UC Course  I have reviewed the triage vital signs and the nursing notes.  Pertinent labs & imaging results that were available during my care of the patient were reviewed by me and considered in my medical decision making (see chart for details).     Patient on exam, findings are reassuring however patient does have an irregularity of heart rhythm when auscultated also given his history of uncontrolled hypertension, recurrent headaches, chest pain and accelerated hypertension on arrival here today I do advise further work-up and evaluation in the setting of the ER with cardiac enzymes to rule out any acute ischemic cardiac involvement. Patient is accompanied today by family who will transport patient to the ER for further work-up and evaluation.  Final Clinical Impressions(s) / UC Diagnoses   Final diagnoses:  Chest pain, unspecified type  Dizziness and giddiness  Essential hypertension   Discharge Instructions   None    ED Prescriptions    None     PDMP not reviewed this encounter.   Bing Neighbors, FNP 12/02/19 1012

## 2019-12-01 NOTE — ED Triage Notes (Signed)
Pt arrives from urgent care with c/o chest pain, headache, dizziness and lower back pain. Reports back pain has been ongoing for the past few months and headache has been present x3 days.

## 2019-12-01 NOTE — Discharge Instructions (Addendum)
If you develop chest pain, shortness of breath, or weakness or have any other concerns please seek additional medical care and evaluation.  As we discussed today taking medicine for blood pressure does not fix the blood pressure.  You will have high blood pressure.  The medicine helps lower your blood pressure while you are taking it.  If you do not take your medicine then your blood pressure will go up again.

## 2019-12-01 NOTE — ED Provider Notes (Signed)
[facility] EMERGENCY [facility]MENT Provider Note   CSN: [id number] Arrival date & time: 12/01/19  1024     History No chief complaint on file.   Jonathan Prince is a 59 y.o. male with a past medical history of stroke in [address] in [place], hypertension, who presents today for evaluation of multiple complaints.  History is obtained through his son who interprets per patient's request.  His son also lives with him.    Patient had run out of his antihypertensive about a week ago.  They were under the impression that you take the medicine for a month and then that fixes the high blood pressure and he does not need to keep taking it.  He has an appointment with his primary care doctor on Monday.  He reports that since he ran out of his antihypertensives he has had a frontal headache.  He reports he had a stroke in [address] in [place], does not know what kind of stroke and does not appear he was taking antihypertensives after that.  He reports no vision changes.  The frontal headache has not moved.  He denies any weakness after his prior stroke and denies any current weakness.  His son does not feel like he is confused.  No difficulty walking.  He denies any current chest pain.    He reports that since he ran out of the blood pressure medication he has intermittently been having chest pain that lasts for half an hour.  No clear triggers.  He was having this intermittently however very infrequently while taking antihypertensives.  He currently has not had any chest pain today, last time he had any was last night.  He thinks it may be related to eating.    When his chest hurts he denies any other symptoms including nausea or vomiting, diaphoresis, radiation of pain, jaw pain or feeling presyncopal.  No leg swelling.  No history of DVT/PE.  HPI     History reviewed. No pertinent past medical history.  Patient Active Problem List   Diagnosis Date Noted  . Hypertension 10/23/2019  . Healthcare  maintenance 10/23/2019  . Contact dermatitis and eczema 10/23/2019    History reviewed. No pertinent surgical history.     No family history on file.  Social History   Tobacco Use  . Smoking status: Never Smoker  . Smokeless tobacco: Never Used  Substance Use Topics  . Alcohol use: Never  . Drug use: Never    Home Medications Prior to Admission medications   Medication Sig Start Date End Date Taking? Authorizing Provider  acetaminophen (TYLENOL) 325 MG tablet Take 650 mg by mouth every 6 (six) hours as needed for fever or headache.    Yes [provider]  hydrocortisone 2.5 % ointment Apply topically 2 (two) times daily. As needed for mild eczema.  Do not use for more than 1-2 weeks at a time. Patient not taking: Reported on 12/01/2019 10/23/19   Sandre Kittylson, Daniel K, MD  losartan (COZAAR) 25 MG tablet Take 1 tablet (25 mg total) by mouth at bedtime. 12/01/19   Cristina GongHammond, Thaila Bottoms W, PA-C  omeprazole (PRILOSEC) 20 MG capsule Take 1 capsule (20 mg total) by mouth 2 (two) times daily before a meal. Patient not taking: Reported on 10/23/2019 05/10/19 06/09/19  Dahlia ByesBast, Traci A, NP  hydrochlorothiazide (HYDRODIURIL) 25 MG tablet Take 1 tablet (25 mg total) by mouth daily. 06/23/18 05/10/19  Fayrene Helperran, Bowie, PA-C    Allergies    Patient has no  known allergies.  Review of Systems   Review of Systems  Constitutional: Negative for chills, diaphoresis, fatigue and fever.  HENT: Negative for congestion, sore throat and trouble swallowing.   Eyes: Negative for visual disturbance.  Respiratory: Negative for cough, chest tightness, shortness of breath and wheezing.   Cardiovascular: Positive for chest pain. Negative for palpitations and leg swelling.  Gastrointestinal: Negative for abdominal pain, diarrhea, nausea and vomiting.  Genitourinary: Negative for dysuria.  Musculoskeletal: Negative for back pain and neck pain.  Skin: Negative for color change and rash.  Neurological: Positive for  headaches. Negative for seizures, syncope, speech difficulty and weakness.  Psychiatric/Behavioral: Negative for confusion.  All other systems reviewed and are negative.   Physical Exam Updated Vital Signs BP (!) 171/85   Pulse 64   Temp 98.5 F (36.9 C) (Oral)   Resp 12   SpO2 100%   Physical Exam Vitals and nursing note reviewed.  Constitutional:      General: He is not in acute distress.    Appearance: He is well-developed.  HENT:     Head: Normocephalic and atraumatic.  Eyes:     Conjunctiva/sclera: Conjunctivae normal.  Cardiovascular:     Rate and Rhythm: Normal rate and regular rhythm.     Pulses: Normal pulses.     Heart sounds: Normal heart sounds. No murmur heard.   Pulmonary:     Effort: Pulmonary effort is normal. No respiratory distress.     Breath sounds: Normal breath sounds.  Abdominal:     Palpations: Abdomen is soft.     Tenderness: There is no abdominal tenderness.  Musculoskeletal:     Cervical back: Normal range of motion and neck supple.     Right lower leg: No edema.     Left lower leg: No edema.  Skin:    General: Skin is warm and dry.  Neurological:     General: No focal deficit present.     Mental Status: He is alert.     Motor: No weakness.     Comments: Patient is awake and alert. Speech is not slurred.  He moves all 4 extremities spontaneously . No weakness in arms or legs.  No pronator drift.  Smile is symmetric.  Pupils PEARL  Psychiatric:        Mood and Affect: Mood normal.        Behavior: Behavior normal.     ED Results / Procedures / Treatments   Labs (all labs ordered are listed, but only abnormal results are displayed) Labs Reviewed  BASIC METABOLIC PANEL - Abnormal; Notable for the following components:      Result Value   Glucose, Bld 67 (*)    BUN 7 (*)    Calcium 8.8 (*)    All other components within normal limits  CBC - Abnormal; Notable for the following components:   RBC 6.34 (*)    MCV 74.6 (*)    MCH 22.6  (*)    All other components within normal limits  RESPIRATORY PANEL BY RT PCR (FLU A&B, COVID)  HEPATIC FUNCTION PANEL  CBG MONITORING, ED  TROPONIN I (HIGH SENSITIVITY)  TROPONIN I (HIGH SENSITIVITY)    EKG EKG Interpretation  Date/Time:  Friday December 01 2019 10:26:23 EST Ventricular Rate:  74 PR Interval:  154 QRS Duration: 94 QT Interval:  392 QTC Calculation: 435 R Axis:   62 Text Interpretation: Sinus rhythm with occasional Premature ventricular complexes Otherwise normal ECG No significant change since last  tracing Confirmed by Richardean Canal [id number]) on 12/01/2019 5:49:42 PM   Radiology DG Chest 2 View  Result Date: 12/01/2019 CLINICAL DATA:  Chest pain EXAM: CHEST - 2 VIEW COMPARISON:  06/23/2018 chest radiograph. FINDINGS: Hypoinflated lungs with linear bibasilar atelectasis. No pneumothorax or pleural effusion. Cardiomediastinal silhouette within normal limits. No acute osseous abnormality. IMPRESSION: No focal airspace disease.  Bibasilar atelectasis. Electronically Signed   By: Stana Bunting M.D.   On: 12/01/2019 10:53   CT Head Wo Contrast  Result Date: 12/01/2019 CLINICAL DATA:  59 year old male with headache. EXAM: CT HEAD WITHOUT CONTRAST TECHNIQUE: Contiguous axial images were obtained from the base of the skull through the vertex without intravenous contrast. COMPARISON:  None. FINDINGS: Brain: The ventricles and sulci appropriate size for patient's age. Mild periventricular and deep white matter chronic microvascular ischemic changes noted. There is a 3.8 x 1.2 cm area of old infarct and encephalomalacia involving the left basal ganglia. There is no acute intracranial hemorrhage. No mass effect or midline shift. No extra-axial fluid collection. Vascular: No hyperdense vessel or unexpected calcification. Skull: Normal. Negative for fracture or focal lesion. Sinuses/Orbits: There is diffuse mucoperiosteal thickening of paranasal sinuses. There is partial  opacification of the right sphenoid sinus. The mastoid air cells are clear. Other: None IMPRESSION: 1. No acute intracranial pathology. 2. 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 3. Paranasal sinus disease. Electronically Signed   By: Elgie Collard M.D.   On: 12/01/2019 17:28    Procedures Procedures (including critical care time)  Medications Ordered in ED Medications  losartan (COZAAR) tablet 25 mg (25 mg Oral Given 12/01/19 1449)    ED Course  I have reviewed the triage vital signs and the nursing notes.  Pertinent labs & imaging results that were available during my care of the patient were reviewed by me and considered in my medical decision making (see chart for details).  Clinical Course as of Nov 30 2200  Caleen Essex Dec 01, 2019  1730 Patient reevaluated.  His blood pressure is slightly improved and he reports that overall he feels better.  He remains neurovascularly intact on my exam.   [EH]    Clinical Course User Index [EH] Norman Clay   MDM Rules/Calculators/A&P                         Patient is a 59 year old [place]ard [place] speaking male who presents today with his son, who he requests interprets, for evaluation of multiple complaints. He ran out of his blood pressure medication about a week ago and since then has had frontal headache.  He additionally has increased frequency of intermittent chest pain.  1: Headache: Patient is neurovascularly intact on my exam.  He does have a history of prior stroke in 2015 reportedly in [place] however further details are unknown.  CT head was obtained without acute abnormality.  Given that his headache has been going on for multiple days I would expect to see an abnormality if he was having stroke.  Additionally he is neurovascularly intact on my exam decreasing my suspicion for CVA.    2: Chest pain: He reports intermittent chest pains.  He has not had any today.  These are more frequent since he ran out of  his blood pressure medication.  EKG is nonischemic.  Chest x-ray without mediastinal widening, consolidation pneumothorax or other cause of his symptoms.  He does not have shortness of breath, nausea vomiting or diaphoresis  with these symptoms and they last for half an hour.  Troponin x2 is not elevated.  He and his son seem to believe this is related to eating.  Unstable angina is considered, however it appears that this is a more long-term chronic issue.  Recommended outpatient follow-up, already has an appointment scheduled with PCP on Monday.  Discussed that if his symptoms return he needs to return to the emergency room.  3: Hypertension: It appears that patient and his son were under the impression that by taking a month of the blood pressure medicine it would fix and resolve the underlying hypertension.  I discussed with them at length that antihypertensives are typically a lifelong treatment, that they will help control the blood pressure however if he stops taking them his blood pressure will elevate.  Patient was given his home p.o. antihypertensives here, after which he felt better despite not having a significant change in his blood pressure.  Discussed DASH diet additionally.  He has an appointment with PCP on Monday, given return precautions and rx for a week of his losartan.   Return precautions were discussed with patient who states their understanding.  At the time of discharge patient denied any unaddressed complaints or concerns.  Patient is agreeable for discharge home.  Note: Portions of this report may have been transcribed using voice recognition software. Every effort was made to ensure accuracy; however, inadvertent computerized transcription errors may be present   Final Clinical Impression(s) / ED Diagnoses Final diagnoses:  Hypertension, unspecified type  Acute nonintractable headache, unspecified headache type    Rx / DC Orders ED Discharge Orders         Ordered     losartan (COZAAR) 25 MG tablet  Daily at bedtime        12/01/19 1747           Norman Clay 12/01/19 2208    Sabino Donovan, MD 12/04/19 1501

## 2019-12-01 NOTE — ED Notes (Signed)
Patient is being discharged from the Urgent Care and sent to the Emergency Department via POV. Per K. Harris, NP, patient is in need of higher level of care due to CP, HA, dizziness, HTN Patient is aware and verbalizes understanding of plan of care.  Vitals:   12/01/19 1009  BP: (!) 171/77  Pulse: 73  Resp: 17  Temp: 97.8 F (36.6 C)  SpO2: 100%

## 2019-12-01 NOTE — ED Notes (Signed)
Patient verbalizes understanding of discharge instructions. Opportunity for questioning and answers were provided. Pt discharged from ED ambulatory.  

## 2019-12-04 ENCOUNTER — Ambulatory Visit: Payer: BC Managed Care – PPO | Admitting: Family Medicine

## 2019-12-23 ENCOUNTER — Other Ambulatory Visit: Payer: Self-pay | Admitting: Family Medicine

## 2019-12-25 NOTE — Telephone Encounter (Signed)
Pt was supposed to follow up with me after starting BP medication last time.  Can we call him to schedule another appointment?

## 2020-01-11 ENCOUNTER — Ambulatory Visit: Payer: BC Managed Care – PPO | Admitting: Family Medicine

## 2021-03-24 IMAGING — DX PORTABLE CHEST - 1 VIEW
1 series · 1 of 1 positions shown · non-contrast
Comparison: None.

CLINICAL DATA: 57-year-old male with fever and cough.

EXAM:
PORTABLE CHEST 1 VIEW

[chest ap]
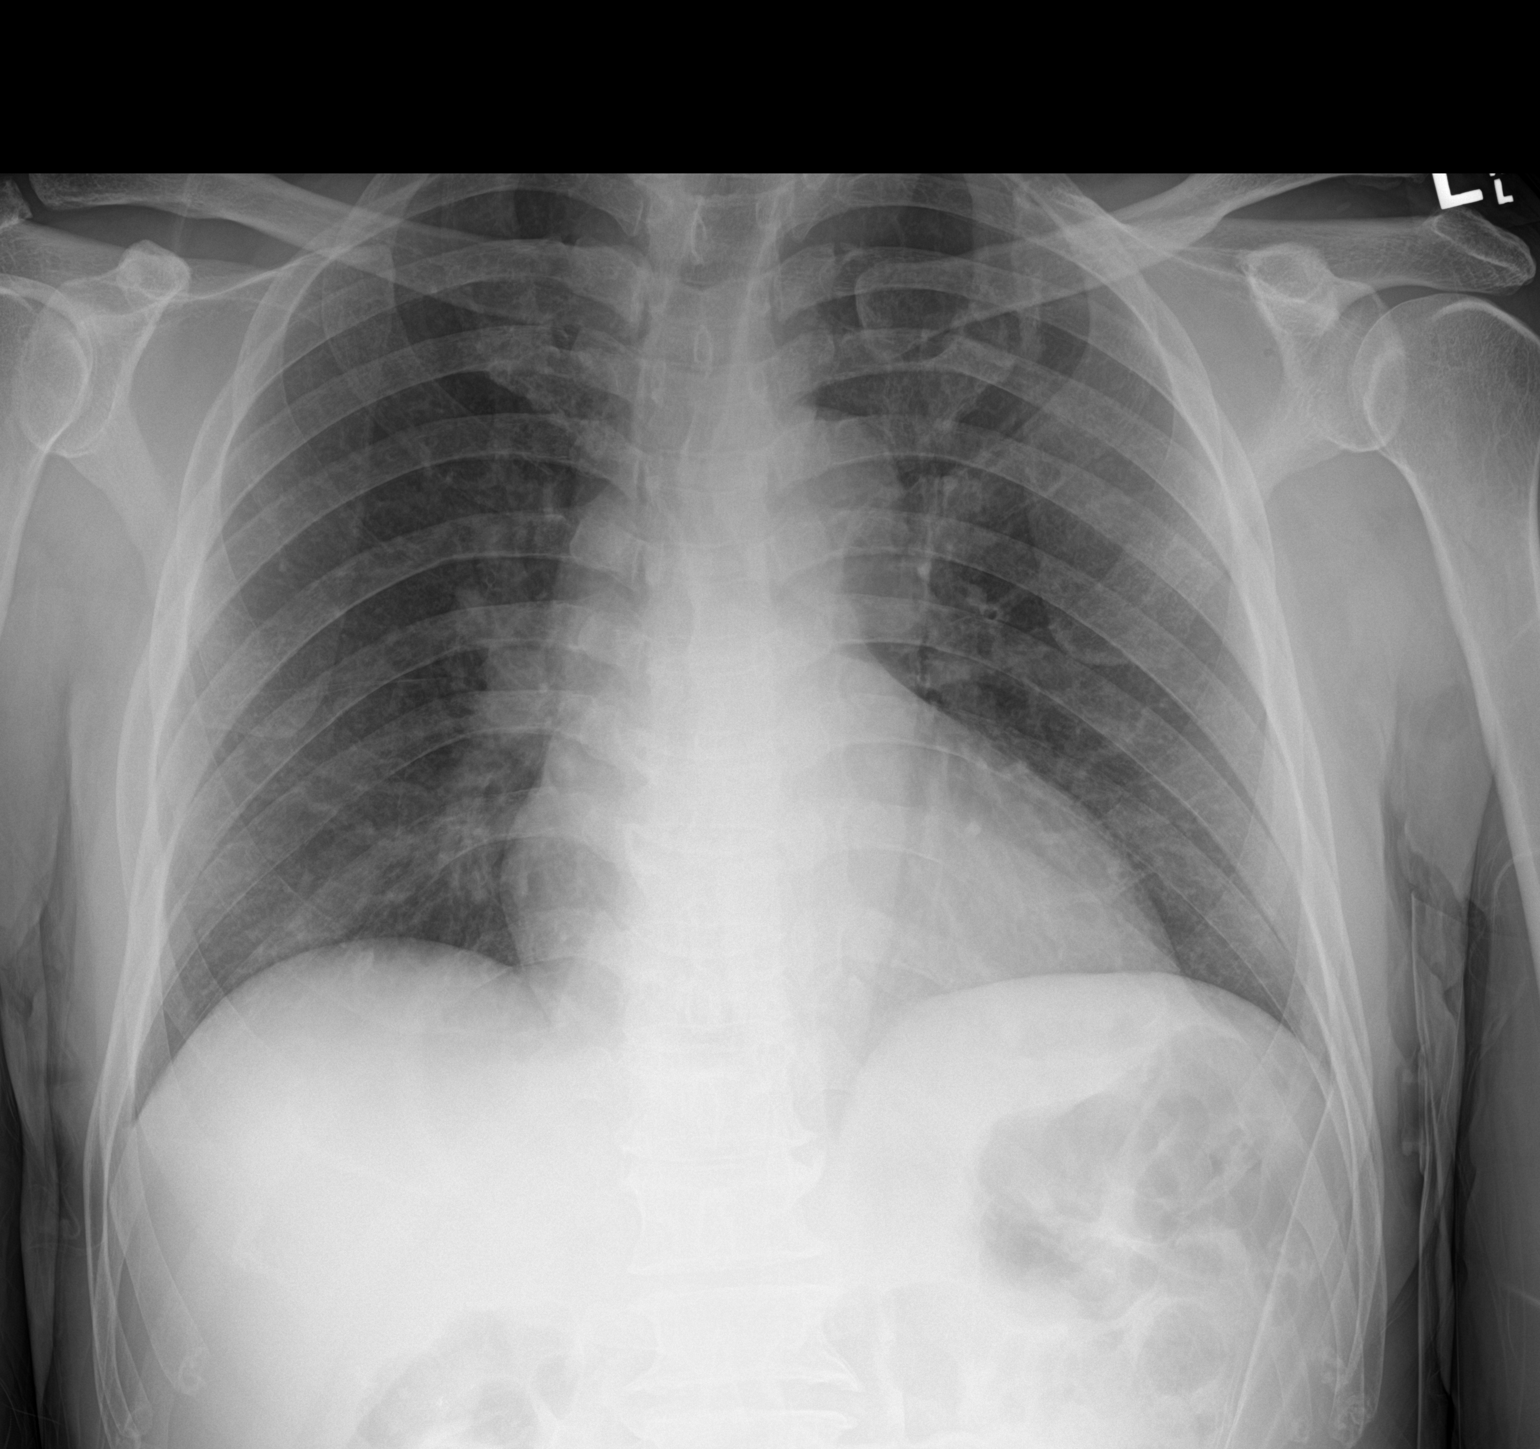

[1 of 1 positions shown; findings below may reference images not displayed]

FINDINGS: The lungs are clear. There is no pleural effusion or pneumothorax.
The cardiac silhouette is within normal limits. No acute osseous
pathology.
IMPRESSION: No active disease.

## 2021-09-08 ENCOUNTER — Ambulatory Visit (HOSPITAL_COMMUNITY)
Admission: EM | Admit: 2021-09-08 | Discharge: 2021-09-08 | Disposition: A | Discharge status: Home / Self Care | Payer: BC Managed Care – PPO | Attending: Family Medicine | Admitting: Family Medicine

## 2021-09-08 ENCOUNTER — Encounter (HOSPITAL_COMMUNITY): Payer: Self-pay

## 2021-09-08 DIAGNOSIS — T148XXA Other injury of unspecified body region, initial encounter: Secondary | ICD-10-CM | POA: Diagnosis not present

## 2021-09-08 DIAGNOSIS — R0789 Other chest pain: Secondary | ICD-10-CM | POA: Diagnosis not present

## 2021-09-08 MED ORDER — IBUPROFEN 800 MG PO TABS: 800.0000 mg | ORAL_TABLET | Freq: Three times a day (TID) | ORAL | 0 refills | Status: DC

## 2021-09-08 MED ORDER — CYCLOBENZAPRINE HCL 5 MG PO TABS: 5.0000 mg | ORAL_TABLET | Freq: Every day | ORAL | 0 refills | Status: AC

## 2021-09-08 NOTE — ED Provider Notes (Signed)
[facility]    CSN: [id number] Arrival date & time: 09/08/21  1111      History   Chief Complaint Chief Complaint  Patient presents with   Weakness    HPI Jonathan Prince is a 61 y.o. male.   Interpreter used today.  He is here for left sided pain, cramping, weakness from this left shoulder down to the left leg.   He is able to walk normally.  He feels weak while working.  He tried to gab something heavy this made him weak and dropped it.  This started 3 days ago after work.   That day he was lifting a lot of heavy objects all day, more than normal.  He did take tylenol over the weekend, helped a little.  He has full rom of the hand/arm/leg.  This morning he went to work, but unable to work as he does a lot of lifting.   History reviewed. No pertinent past medical history.  Patient Active Problem List   Diagnosis Date Noted   Hypertension 10/23/2019   Healthcare maintenance 10/23/2019   Contact dermatitis and eczema 10/23/2019    History reviewed. No pertinent surgical history.     Home Medications    Prior to Admission medications   Medication Sig Start Date End Date Taking? Authorizing Provider  acetaminophen (TYLENOL) 325 MG tablet Take 650 mg by mouth every 6 (six) hours as needed for fever or headache.     [provider]  hydrocortisone 2.5 % ointment Apply topically 2 (two) times daily. As needed for mild eczema.  Do not use for more than 1-2 weeks at a time. Patient not taking: Reported on 12/01/2019 10/23/19   Sandre Kitty, MD  losartan (COZAAR) 25 MG tablet TAKE 1 TABLET BY MOUTH EVERYDAY AT BEDTIME 12/25/19   Sandre Kitty, MD  omeprazole (PRILOSEC) 20 MG capsule Take 1 capsule (20 mg total) by mouth 2 (two) times daily before a meal. Patient not taking: Reported on 10/23/2019 05/10/19 06/09/19  Dahlia Byes A, NP  hydrochlorothiazide (HYDRODIURIL) 25 MG tablet Take 1 tablet (25 mg total) by mouth daily. 06/23/18 05/10/19  Fayrene Helper, PA-C     Family History History reviewed. No pertinent family history.  Social History Social History   Tobacco Use   Smoking status: Never   Smokeless tobacco: Never  Substance Use Topics   Alcohol use: Never   Drug use: Never     Allergies   Patient has no known allergies.   Review of Systems Review of Systems  Constitutional: Negative.   HENT: Negative.    Respiratory: Negative.    Cardiovascular: Negative.   Gastrointestinal: Negative.   Genitourinary: Negative.   Musculoskeletal:  Positive for myalgias.  Skin: Negative.   Neurological:  Positive for weakness.  Psychiatric/Behavioral: Negative.       Physical Exam Triage Vital Signs ED Triage Vitals  Enc Vitals Group     BP 09/08/21 1218 (!) 171/68     Pulse Rate 09/08/21 1218 63     Resp 09/08/21 1218 18     Temp 09/08/21 1218 (!) 97.4 F (36.3 C)     Temp src --      SpO2 09/08/21 1218 97 %     Weight --      Height --      Head Circumference --      Peak Flow --      Pain Score 09/08/21 1219 0     Pain Loc --  Pain Edu? --      Excl. in GC? --    No data found.  Updated Vital Signs BP (!) 171/68 (BP Location: Left Arm)   Pulse 63   Temp (!) 97.4 F (36.3 C)   Resp 18   SpO2 97%   Visual Acuity Right Eye Distance:   Left Eye Distance:   Bilateral Distance:    Right Eye Near:   Left Eye Near:    Bilateral Near:     Physical Exam Constitutional:      Appearance: Normal appearance.  Cardiovascular:     Rate and Rhythm: Normal rate and regular rhythm.  Pulmonary:     Effort: Pulmonary effort is normal.     Breath sounds: Normal breath sounds.  Musculoskeletal:        General: Tenderness present. No deformity. Normal range of motion.     Comments: TTP to the left chest wall;  Full ROM of all extremities;  normal strength throughout  Neurological:     General: No focal deficit present.     Mental Status: He is alert and oriented to person, place, and time.     Sensory: No  sensory deficit.     Motor: No weakness.     Gait: Gait normal.     Deep Tendon Reflexes: Reflexes normal.  Psychiatric:        Mood and Affect: Mood normal.      UC Treatments / Results  Labs (all labs ordered are listed, but only abnormal results are displayed) Labs Reviewed - No data to display  EKG   Radiology No results found.  Procedures Procedures (including critical care time)  Medications Ordered in UC Medications - No data to display  Initial Impression / Assessment and Plan / UC Course  I have reviewed the triage vital signs and the nursing notes.  Pertinent labs & imaging results that were available during my care of the patient were reviewed by me and considered in my medical decision making (see chart for details).   Final Clinical Impressions(s) / UC Diagnoses   Final diagnoses:  Chest wall pain  Muscle strain     Discharge Instructions      He was seen today for weakness on the left side, likely due to muscle strain of the chest wall.  I have sent out motrin to the pharmacy.  Please take with food to avoid upset stomach.  I have sent out a muscle relaxer.  Please take at night before bed as this will make you tired/sleepy.  I recommend a heating pad as well.  You may take off work and rest the next several days.  Follow up if not improving as expected.     ED Prescriptions     Medication Sig Dispense Auth. Provider   ibuprofen (ADVIL) 800 MG tablet Take 1 tablet (800 mg total) by mouth 3 (three) times daily. 21 tablet Bassheva Flury, MD   cyclobenzaprine (FLEXERIL) 5 MG tablet Take 1 tablet (5 mg total) by mouth at bedtime for 5 days. 5 tablet Jannifer Franklin, MD      PDMP not reviewed this encounter.   Jannifer Franklin, MD 09/08/21 1240

## 2021-09-08 NOTE — ED Triage Notes (Addendum)
Pt c/o pain and weakness to whole lt side since Friday. States dropping stuff out of his lt hand. States last Friday he was lifting heavy metal at work that was more than normal then the pain and weakness started that evening. Taking tylenol and Advil with some relief. No facial droop or drift noted.

## 2021-09-08 NOTE — Discharge Instructions (Signed)
He was seen today for weakness on the left side, likely due to muscle strain of the chest wall.  I have sent out motrin to the pharmacy.  Please take with food to avoid upset stomach.  I have sent out a muscle relaxer.  Please take at night before bed as this will make you tired/sleepy.  I recommend a heating pad as well.  You may take off work and rest the next several days.  Follow up if not improving as expected.

## 2022-09-01 IMAGING — DX DG CHEST 2V
2 series · 2 of 2 positions shown · non-contrast
Comparison: 06/23/2018 chest radiograph.

CLINICAL DATA: Chest pain

EXAM:
CHEST - 2 VIEW

[w chest pa]
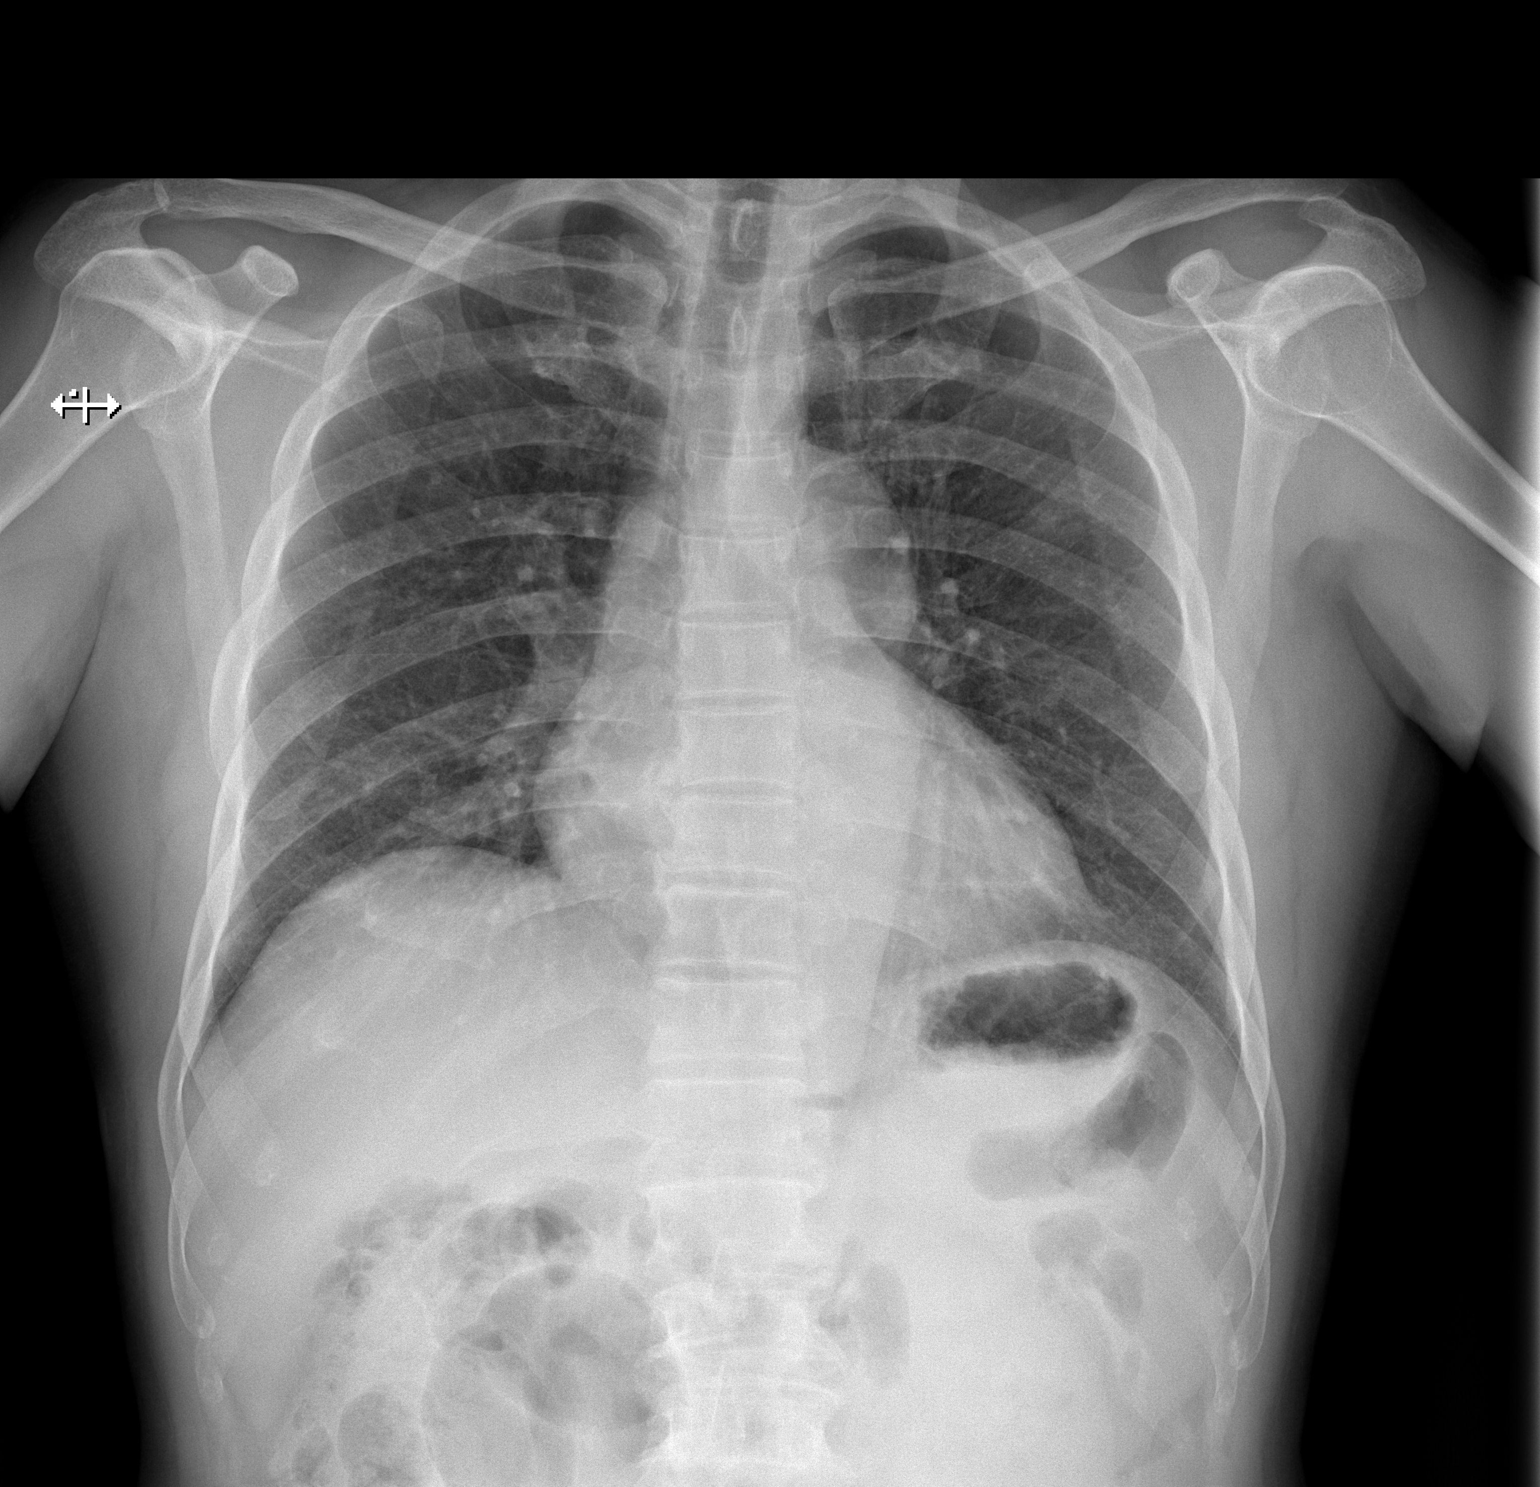

[w chest lat]
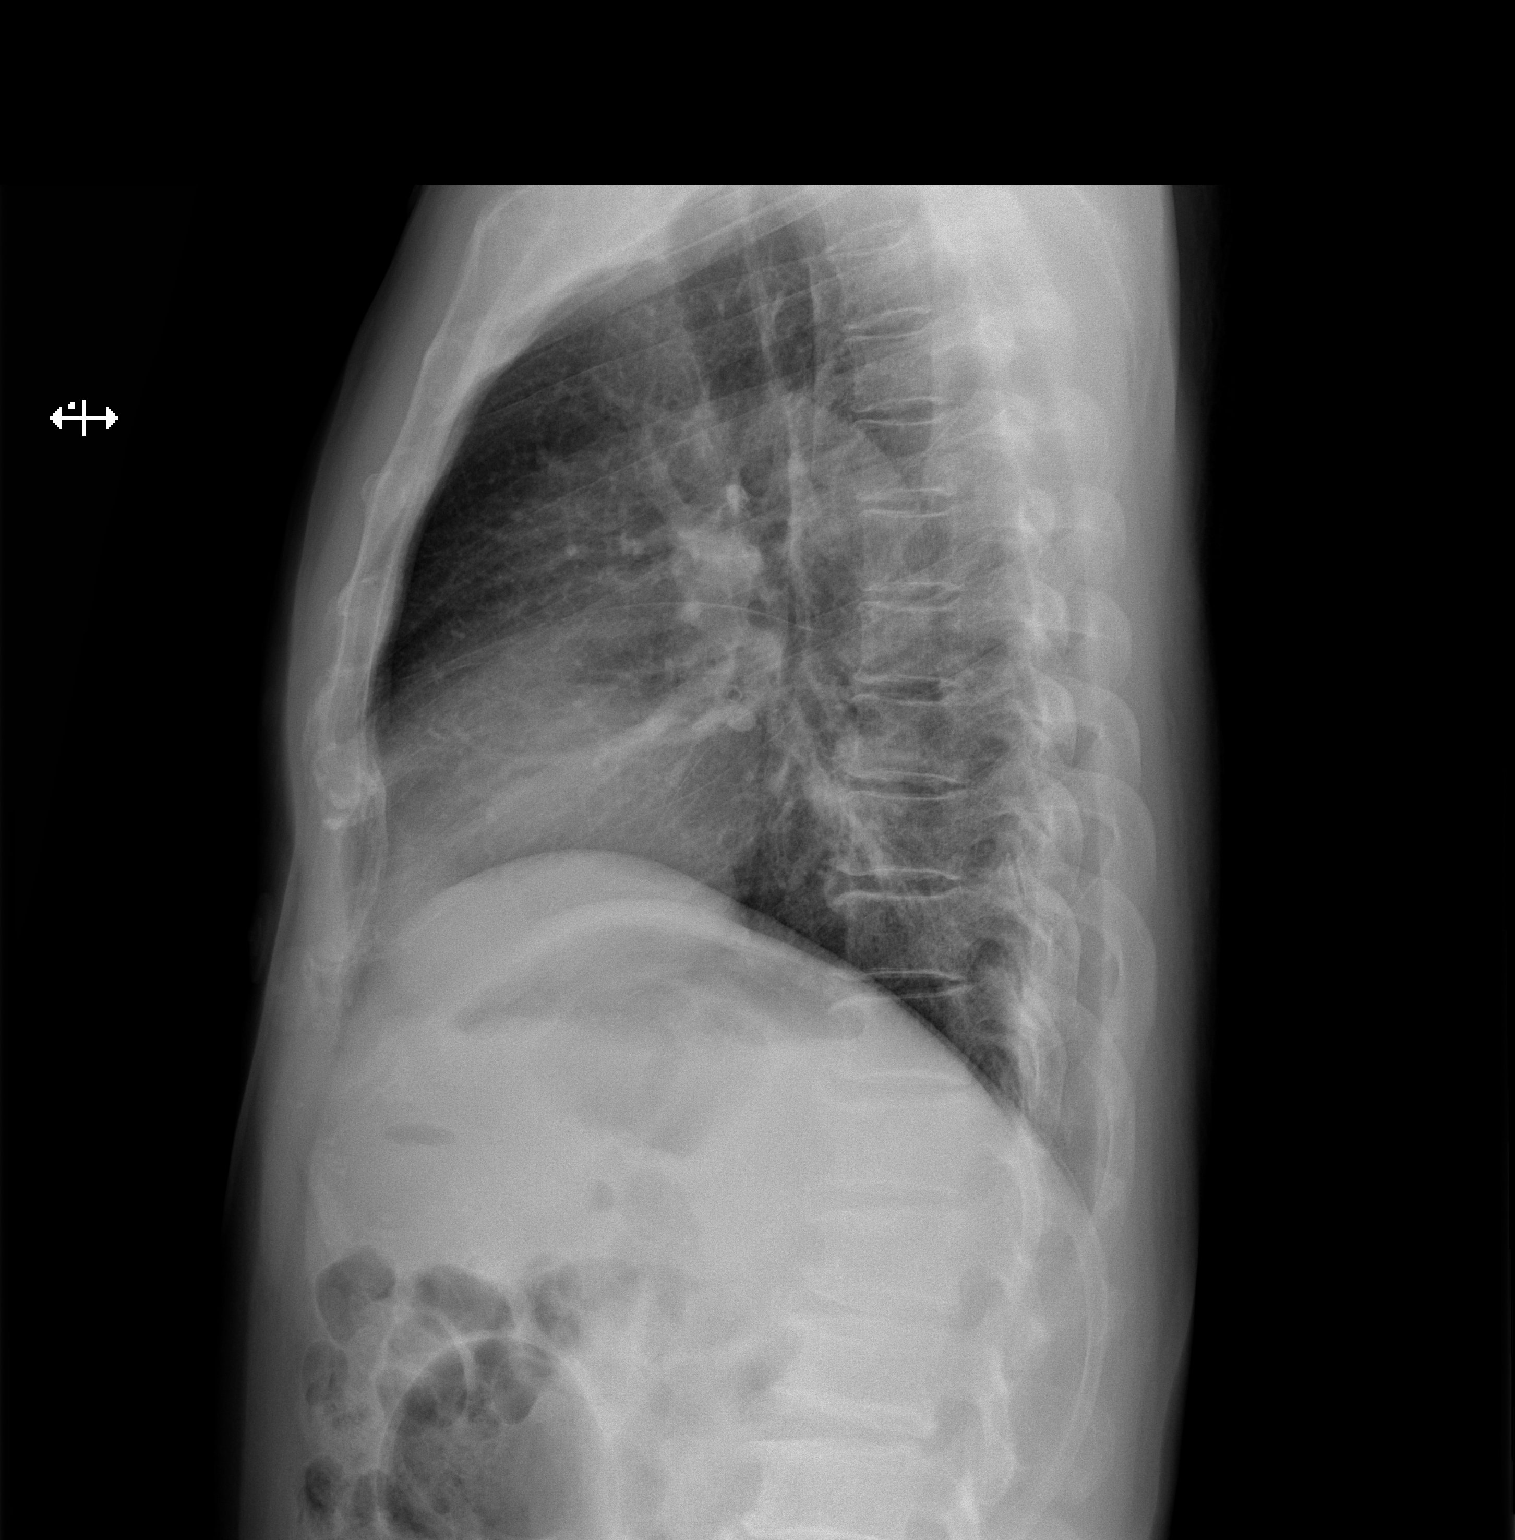

[2 of 2 positions shown; findings below may reference images not displayed]

FINDINGS: Hypoinflated lungs with linear bibasilar atelectasis. No
pneumothorax or pleural effusion. Cardiomediastinal silhouette
within normal limits. No acute osseous abnormality.
IMPRESSION: No focal airspace disease.  Bibasilar atelectasis.

## 2022-11-19 NOTE — Progress Notes (Unsigned)
  SUBJECTIVE:   CHIEF COMPLAINT / HPI:   Jonathan Prince is a 62 y.o. who presents today for an annual exam. Every time he eats, he gets epigastric pain. It is worse when he is eating large amounts. Not tried any medications. The pain does not radiate to either arm.   He brings records from hospital in [place] in which they were there for 1-2 weeks. He was prescribed PPI (pantoprazole). He has not taken this since August.  Hypertension: He has been in [place] so has not been clearly taking medication.  Denies acute vision changes, chest pain/pressure, shortness of breath.  [place]agn[place] interpreter used throughout encounter. Also presents with his son.   PERTINENT  PMH / PSH: HTN  OBJECTIVE:  BP (!) 180/70   Pulse 82   Ht 5\' 3"  (1.6 m)   Wt 135 lb 8 oz (61.5 kg)   SpO2 99%   BMI 24.00 kg/m  General: Well-appearing, NAD CV: RRR, no murmurs auscultated Pulm: CTAB, normal WOB Abdomen: Epigastric tenderness, soft abdomen, normoactive bowel sounds  ASSESSMENT/PLAN:   Assessment & Plan Epigastric pain I am unable to read records from [place] as they are in a different language.  However, endoscopy pictures show what appears to be Barrett's esophageal changes and polyps.  Medication records show what looks like triple or quadruple therapy for H. pylori.  I am unable to assess whether patient took or completed these medications.  Given it has been several months, will retest for H. pylori and send referral to GI for further management/assistance and additionally consideration for colonoscopy as he has never received this before.  I will attempt to have endoscopy and appropriate medication pictures placed into the media section of chart in hopes this may assist GI workup.  I will hold onto documentation until patient returns for next week's visit and return this paperwork to them at that time. Primary hypertension BP: (!) 180/70 today. Poorly controlled. Goal of <130/80. Medication regimen: Start  olmesartan-HCTZ 20-12.5 mg daily.  Check BMP today.  Return in 1 week and will check BMP again at that visit and adjust hypertensive medications as necessary. Screening for diabetes mellitus (DM) Obtain A1c. Screening for hyperlipidemia Obtain lipid panel Encounter for immunization Flu vaccine  Return in 1 week (on 11/27/2022) for HTN and epigastric pain follow-up. Shelby Mattocks, DO 11/21/2022, 2:39 PM PGY-3, [facility]

## 2022-11-20 ENCOUNTER — Encounter: Payer: Self-pay | Admitting: Student

## 2022-11-20 ENCOUNTER — Ambulatory Visit (INDEPENDENT_AMBULATORY_CARE_PROVIDER_SITE_OTHER): Payer: BLUE CROSS/BLUE SHIELD | Admitting: Student

## 2022-11-20 VITALS — BP 180/70 | HR 82 | Ht 63.0 in | Wt 135.5 lb

## 2022-11-20 DIAGNOSIS — Z1159 Encounter for screening for other viral diseases: Secondary | ICD-10-CM

## 2022-11-20 DIAGNOSIS — R1013 Epigastric pain: Secondary | ICD-10-CM | POA: Diagnosis not present

## 2022-11-20 DIAGNOSIS — Z114 Encounter for screening for human immunodeficiency virus [HIV]: Secondary | ICD-10-CM

## 2022-11-20 DIAGNOSIS — Z23 Encounter for immunization: Secondary | ICD-10-CM

## 2022-11-20 DIAGNOSIS — I10 Essential (primary) hypertension: Secondary | ICD-10-CM

## 2022-11-20 DIAGNOSIS — Z131 Encounter for screening for diabetes mellitus: Secondary | ICD-10-CM

## 2022-11-20 DIAGNOSIS — Z13220 Encounter for screening for lipoid disorders: Secondary | ICD-10-CM | POA: Diagnosis not present

## 2022-11-20 LAB — POCT GLYCOSYLATED HEMOGLOBIN (HGB A1C): Hemoglobin A1C: 4.9 % (ref 4.0–5.6)

## 2022-11-20 MED ORDER — OLMESARTAN MEDOXOMIL-HCTZ 20-12.5 MG PO TABS: 1.0000 | ORAL_TABLET | Freq: Every day | ORAL | 0 refills | Status: DC

## 2022-11-20 NOTE — Assessment & Plan Note (Signed)
I am unable to read records from [place] as they are in a different language.  However, endoscopy pictures show what appears to be Barrett's esophageal changes and polyps.  Medication records show what looks like triple or quadruple therapy for H. pylori.  I am unable to assess whether patient took or completed these medications.  Given it has been several months, will retest for H. pylori and send referral to GI for further management/assistance and additionally consideration for colonoscopy as he has never received this before.

## 2022-11-20 NOTE — Assessment & Plan Note (Signed)
BP: (!) 180/70 today. Poorly controlled. Goal of <130/80. Medication regimen: Start olmesartan-HCTZ 20-12.5 mg daily.  Check BMP today.  Return in 1 week and will check BMP again at that visit and adjust hypertensive medications as necessary.

## 2022-11-20 NOTE — Patient Instructions (Addendum)
It was great to see you today! Thank you for choosing [facility] for your primary care.  Today we addressed: Hypertension: Your blood pressure is very elevated.  I am starting you on a medication which is a combo pill for blood pressure.  We will check your blood pressure again next week and likely get more lab work next week.  Today I am also checking her kidney function so that we can have a baseline. Epigastric pain: The endoscopy shows that there may be some transitional changes or polyps in your esophagus or stomach and I would like GI to be involved.  I will submit a referral and additionally you are due for a colonoscopy so they will be able to assist you with that as well.  I am testing you for H. pylori disease as I see notes in your records that you may have been treated for H. pylori in the past although unsure if you truly completed this.  If you haven't already, sign up for My Chart to have easy access to your labs results, and communication with your primary care physician. We are checking some labs today. If they are abnormal, I will call you. If they are normal, I will send you a MyChart message (if it is active) or a letter in the mail. If you do not hear about your labs in the next 2 weeks, please call the office. I recommend that you always bring your medications to each appointment as this makes it easy to ensure you are on the correct medications and helps [place] not miss refills when you need them. Call the clinic at [phone] if your symptoms worsen or you have any concerns. Return in 1 week (on 11/27/2022) for HTN and epigastric pain follow-up. Please arrive 15 minutes before your appointment to ensure smooth check in process.  We appreciate your efforts in making this happen.  Thank you for allowing me to participate in your care, Shelby Mattocks, DO 11/20/2022, 9:30 AM PGY-3, [facility]

## 2022-11-22 LAB — BASIC METABOLIC PANEL
BUN/Creatinine Ratio: 18 (ref 10–24)
BUN: 13 mg/dL (ref 8–27)
CO2: 23 mmol/L (ref 20–29)
Calcium: 8.7 mg/dL (ref 8.6–10.2)
Chloride: 103 mmol/L (ref 96–106)
Creatinine, Ser: 0.73 mg/dL — ABNORMAL LOW (ref 0.76–1.27)
Glucose: 64 mg/dL — ABNORMAL LOW (ref 70–99)
Potassium: 4 mmol/L (ref 3.5–5.2)
Sodium: 140 mmol/L (ref 134–144)
eGFR: 102 mL/min/{1.73_m2} (ref >59)

## 2022-11-22 LAB — LIPID PANEL
Chol/HDL Ratio: 3.8 ratio (ref 0.0–5.0)
Cholesterol, Total: 191 mg/dL (ref 100–199)
HDL: 50 mg/dL (ref >39)
LDL Chol Calc (NIH): 121 mg/dL — ABNORMAL HIGH (ref 0–99)
Triglycerides: 110 mg/dL (ref 0–149)
VLDL Cholesterol Cal: 20 mg/dL (ref 5–40)

## 2022-11-22 LAB — HCV INTERPRETATION

## 2022-11-22 LAB — H. PYLORI BREATH TEST: H pylori Breath Test: NEGATIVE

## 2022-11-22 LAB — HCV AB W REFLEX TO QUANT PCR: HCV Ab: NONREACTIVE

## 2022-11-22 LAB — HIV ANTIBODY (ROUTINE TESTING W REFLEX): HIV Screen 4th Generation wRfx: NONREACTIVE

## 2022-11-24 ENCOUNTER — Encounter: Payer: Self-pay | Admitting: Student

## 2022-11-24 DIAGNOSIS — E785 Hyperlipidemia, unspecified: Secondary | ICD-10-CM | POA: Insufficient documentation

## 2022-11-26 ENCOUNTER — Other Ambulatory Visit: Payer: Self-pay | Admitting: Student

## 2022-11-26 DIAGNOSIS — R1013 Epigastric pain: Secondary | ICD-10-CM

## 2022-11-26 NOTE — Progress Notes (Signed)
  SUBJECTIVE:   CHIEF COMPLAINT / HPI:   Epigastric pain: Presents for follow-up.  H. pylori test negative.  At last visit, referral to GI for endoscopy and colonoscopy was placed.  Hypertension: BP: 139/65 today. Home medications include: Olmesartan-HCTZ 20-12.5 mg daily. He endorses taking these medications as prescribed. Patient has had a BMP in the past 1 year.   Hyperlipidemia: Home medications include none. LDL elevated at 121.  Foot pain: Present for the last couple months more specifically at the end of the day.  He changed shoes at work a couple months ago and states the pain is worse when he is using those shoes.  Denies history of trauma.  Patient specifically along the instep of his right foot.  Jonathan Prince interpreter used throughout encounter.  Son is also present for this encounter. Interpreter's name is Jonathan Prince.  PERTINENT  PMH / PSH: HTN  OBJECTIVE:  BP 139/65   Pulse 67   Temp 97.6 F (36.4 C)   Wt 135 lb 6.4 oz (61.4 kg)   SpO2 98%   BMI 23.99 kg/m  General: Well-appearing, NAD MSK, right foot: No obvious signs of trauma, no bruising or deformity, normal ROM, 2+ pedal pulse, sensation intact, no appreciable tenderness to plantar surface  ASSESSMENT/PLAN:   Assessment & Plan Hyperlipidemia, unspecified hyperlipidemia type LDL 121.  ASCVD risk calculator with 15.2% 10-year risk of cardiovascular event.  Start atorvastatin 40 mg daily.  Recheck lipid panel in 3 months. Primary hypertension BP: 139/65 today. Well controlled. Goal of <140/90.  Recheck BMP. Medication regimen: Olmesartan-HCTZ 20-12.5 mg daily Epigastric pain Reviewed lab results.  GI referral in process.  Start Protonix 40 mg daily.  If this does not improve symptoms in 1 to 2 weeks, increase to twice a day. Right foot pain For the last 2 to 3 months, suspect related to recent change in footwear given time correlation.  Advised different shoes, follow-up as needed. Return in about 3 months  (around 02/27/2023) for Cholesterol follow-up. Jonathan Mattocks, DO 11/27/2022, 1:18 PM PGY-3, [facility]

## 2022-11-27 ENCOUNTER — Encounter: Payer: Self-pay | Admitting: Student

## 2022-11-27 ENCOUNTER — Ambulatory Visit (INDEPENDENT_AMBULATORY_CARE_PROVIDER_SITE_OTHER): Payer: BLUE CROSS/BLUE SHIELD | Admitting: Student

## 2022-11-27 VITALS — BP 139/65 | HR 67 | Temp 97.6°F | Wt 135.4 lb

## 2022-11-27 DIAGNOSIS — E785 Hyperlipidemia, unspecified: Secondary | ICD-10-CM

## 2022-11-27 DIAGNOSIS — M79671 Pain in right foot: Secondary | ICD-10-CM | POA: Insufficient documentation

## 2022-11-27 DIAGNOSIS — I10 Essential (primary) hypertension: Secondary | ICD-10-CM

## 2022-11-27 DIAGNOSIS — R1013 Epigastric pain: Secondary | ICD-10-CM

## 2022-11-27 MED ORDER — PANTOPRAZOLE SODIUM 40 MG PO TBEC: 40.0000 mg | DELAYED_RELEASE_TABLET | Freq: Every day | ORAL | 0 refills | Status: DC

## 2022-11-27 MED ORDER — ATORVASTATIN CALCIUM 40 MG PO TABS: 40.0000 mg | ORAL_TABLET | Freq: Every day | ORAL | 3 refills | Status: DC

## 2022-11-27 NOTE — Assessment & Plan Note (Signed)
BP: 139/65 today. Well controlled. Goal of <140/90.  Recheck BMP. Medication regimen: Olmesartan-HCTZ 20-12.5 mg daily

## 2022-11-27 NOTE — Patient Instructions (Addendum)
It was great to see you today! Thank you for choosing [facility] for your primary care.  Today we addressed: We are rechecking some blood work to make sure your kidney function and potassium are okay since starting the blood pressure medication.  If this blood work is normal I will send in a full year prescription of this medication. I have started you on a cholesterol medication.  Take this daily. I am starting you on an acid blocker while you are awaiting being set up with gastroenterology.  Please take this medication daily.   If you haven't already, sign up for My Chart to have easy access to your labs results, and communication with your primary care physician. We are checking some labs today. If they are abnormal, I will call you. If they are normal, I will send you a MyChart message (if it is active) or a letter in the mail. If you do not hear about your labs in the next 2 weeks, please call the office. Return in about 3 months (around 02/27/2023) for Cholesterol follow-up. Please arrive 15 minutes before your appointment to ensure smooth check in process.  We appreciate your efforts in making this happen.  Thank you for allowing me to participate in your care, Shelby Mattocks, DO 11/27/2022, 11:41 AM PGY-3, [facility]

## 2022-11-27 NOTE — Assessment & Plan Note (Signed)
LDL 121.  ASCVD risk calculator with 15.2% 10-year risk of cardiovascular event.  Start atorvastatin 40 mg daily.  Recheck lipid panel in 3 months.

## 2022-11-27 NOTE — Assessment & Plan Note (Signed)
Reviewed lab results.  GI referral in process.  Start Protonix 40 mg daily.  If this does not improve symptoms in 1 to 2 weeks, increase to twice a day.

## 2022-11-27 NOTE — Assessment & Plan Note (Signed)
For the last 2 to 3 months, suspect related to recent change in footwear given time correlation.  Advised different shoes, follow-up as needed.

## 2022-11-28 LAB — BASIC METABOLIC PANEL
BUN/Creatinine Ratio: 18 (ref 10–24)
BUN: 17 mg/dL (ref 8–27)
CO2: 25 mmol/L (ref 20–29)
Calcium: 9.1 mg/dL (ref 8.6–10.2)
Chloride: 102 mmol/L (ref 96–106)
Creatinine, Ser: 0.94 mg/dL (ref 0.76–1.27)
Glucose: 78 mg/dL (ref 70–99)
Potassium: 4.1 mmol/L (ref 3.5–5.2)
Sodium: 141 mmol/L (ref 134–144)
eGFR: 91 mL/min/{1.73_m2} (ref >59)

## 2022-11-30 ENCOUNTER — Other Ambulatory Visit: Payer: Self-pay | Admitting: Student

## 2022-11-30 ENCOUNTER — Encounter: Payer: Self-pay | Admitting: Student

## 2022-11-30 DIAGNOSIS — I10 Essential (primary) hypertension: Secondary | ICD-10-CM

## 2022-11-30 MED ORDER — OLMESARTAN MEDOXOMIL-HCTZ 20-12.5 MG PO TABS: 1.0000 | ORAL_TABLET | Freq: Every day | ORAL | 3 refills | Status: DC

## 2023-01-05 ENCOUNTER — Encounter: Payer: Self-pay | Admitting: Family Medicine

## 2023-01-28 ENCOUNTER — Other Ambulatory Visit: Payer: Self-pay

## 2023-01-28 DIAGNOSIS — R1013 Epigastric pain: Secondary | ICD-10-CM

## 2023-01-28 MED ORDER — PANTOPRAZOLE SODIUM 40 MG PO TBEC: 40.0000 mg | DELAYED_RELEASE_TABLET | Freq: Every day | ORAL | 0 refills | Status: DC

## 2023-06-11 ENCOUNTER — Ambulatory Visit (INDEPENDENT_AMBULATORY_CARE_PROVIDER_SITE_OTHER): Admitting: Family Medicine

## 2023-06-11 VITALS — BP 150/86 | HR 58 | Ht 63.0 in | Wt 137.8 lb

## 2023-06-11 DIAGNOSIS — E785 Hyperlipidemia, unspecified: Secondary | ICD-10-CM | POA: Diagnosis not present

## 2023-06-11 DIAGNOSIS — R5383 Other fatigue: Secondary | ICD-10-CM | POA: Diagnosis not present

## 2023-06-11 DIAGNOSIS — I10 Essential (primary) hypertension: Secondary | ICD-10-CM

## 2023-06-11 DIAGNOSIS — L853 Xerosis cutis: Secondary | ICD-10-CM

## 2023-06-11 DIAGNOSIS — R1013 Epigastric pain: Secondary | ICD-10-CM

## 2023-06-11 DIAGNOSIS — L259 Unspecified contact dermatitis, unspecified cause: Secondary | ICD-10-CM

## 2023-06-11 MED ORDER — OLMESARTAN MEDOXOMIL-HCTZ 20-12.5 MG PO TABS: 1.0000 | ORAL_TABLET | Freq: Every day | ORAL | 3 refills | Status: AC

## 2023-06-11 MED ORDER — PANTOPRAZOLE SODIUM 40 MG PO TBEC: 40.0000 mg | DELAYED_RELEASE_TABLET | Freq: Every day | ORAL | 0 refills | Status: DC

## 2023-06-11 MED ORDER — ATORVASTATIN CALCIUM 40 MG PO TABS: 40.0000 mg | ORAL_TABLET | Freq: Every day | ORAL | 3 refills | Status: AC

## 2023-06-11 NOTE — Patient Instructions (Addendum)
 Ti ? n?p l?i atorvastatin  (cho cholesterol), olmesartan -hydrochlorothiazide  (cho huy?t p cao) v pantoprazole  (cho c?n ?au b?ng trn) c?a b?n. Hy ??m b?o dng nh?ng th? ny m?i ngy.  Ti khuyn b?n nn s? d?ng Vaseline cho c? hai chn v chng c v? r?t kh. ?i?u ny c th? gip gi?m c?m gic kh ch?u khi b? p l?c n??c.  Ti ? l?y nhi?u xt nghi?m trong ngy hm nay ?? ki?m tra l do gy m?t m?i. Ti s? lin h? v?i b?n v? v?n ?? ny.  Hy ??m b?o lin h? v?i bc s? chuyn khoa d? dy ?? ??t l?ch h?n v?i h? ?? ?nh gi thm v? c?n ?au b?ng c?a b?n: [facility] [address] [place] [phone]  --------------------------------  I refilled your atorvastatin  (for a cholesterol), olmesartan -hydrochlorothiazide  (for your high blood pressure), and pantoprazole  (for your upper abdominal pain).  Please be sure to take these every day.  I recommend using Vaseline on both of your legs as they seem to be very dry.  This may help with the discomfort you feel with water pressure.  I collected multiple labs today to test for reasons for fatigue.  I will be in contact with you about this.  Please be sure to contact the stomach doctors to get an appointment with them for further evaluation of your abdominal pain: [facility] [address] [place] [phone]

## 2023-06-11 NOTE — Progress Notes (Signed)
    SUBJECTIVE:   CHIEF COMPLAINT / HPI: presents with friend who he elects to interpret for him today.  HLD, HTN Previously placed on lipitor 40 mg daily. Taking olemsartan-hydrochlorothiazide  for BP without issue.  Epigastric pain Previously negative H. Pylori. GI referral placed for endoscopy and colonoscopy. Unable to reach patient from GI office. Pain is burning and there after he eats. Pain is similar at rest or with exertion. He was given protonix  at last visit which has helped, but he has been out of this. He does not have much of an appetite. Weight has been stable. No night sweats when he does not wear a lot of layers. No vomiting, constipation, diarrhea.  Fatigue Feels his body is tired. He coughs at night at times. He also has headaches that are responsive to tylenol. He does not endorse sadness or depression.  Pain in leg Only when touching water, especially when he used the garden hose and the water got on his legs. It felt like it was burning. This does not happen in the day but toward the evening when he touches the garden hose water it hurts. He can shower and bathe okay without pain.  PERTINENT  PMH / PSH: eczema  OBJECTIVE:   BP (!) 150/86   Pulse (!) 58 (recheck: 60)  Ht 5\' 3"  (1.6 m)   Wt 137 lb 12.8 oz (62.5 kg)   SpO2 99%   BMI 24.41 kg/m   General: Alert and oriented, in NAD Skin: Warm, dry, and intact; dry skin with mild fissuring over anterior ankle at top of bilateral dorsal feet HEENT: NCAT, EOM grossly normal, midline nasal septum Cardiac: RRR, no m/r/g appreciated Respiratory: CTAB, breathing and speaking comfortably on RA Abdominal: Soft, mildly tender to palpation in the epigastric area without rebound or guarding, nondistended, normoactive bowel sounds Extremities: Moves all extremities grossly equally Neurological: No gross focal deficit, bilateral lower extremities with strength and sensation intact Psychiatric: Appropriate mood and affect    ASSESSMENT/PLAN:   Assessment & Plan Primary hypertension Uncontrolled.  Reassuringly comfortable on exam today.  Appears last fill for this medication was in January 2025.  Refilled medicine today advised him and his friend to pick up when he leaves.  Advised to follow-up in 1 month for this and issues below. Hyperlipidemia, unspecified hyperlipidemia type Rechecking lipid panel today.  Refilled Lipitor to be taken daily. Epigastric pain GERD versus PUD most likely cause given mild tenderness to palpation on the epigastric area, burning quality, and relief with Protonix .  Given appetite suppression, thought of potential malignancy; however, his weight has been stable, and he does not endorse any B symptoms.  We will collect CMP today to update hepatic function.  Gave number to his friend to call GI to schedule appointment for further evaluation for possible endoscopy and definite colonoscopy. Other fatigue Differential is broad.  Will further evaluate with CBC, BMP, TSH, vitamin D, vitamin B12.  Less likely mood contributor. Dry skin dermatitis Dry skin over the feet/ankles could be contributing to intermittent discomfort patient feels when he is sprayed with a water hose on his legs in the garden.  Reassuringly normal exam otherwise today.  Advised to use Vaseline on the bilateral lower extremities to help lock in moisture.  Follow-up as needed.   Genetta Kenning, MD [facility]

## 2023-06-11 NOTE — Assessment & Plan Note (Signed)
 Rechecking lipid panel today.  Refilled Lipitor to be taken daily.

## 2023-06-11 NOTE — Assessment & Plan Note (Signed)
 GERD versus PUD most likely cause given mild tenderness to palpation on the epigastric area, burning quality, and relief with Protonix .  Given appetite suppression, thought of potential malignancy; however, his weight has been stable, and he does not endorse any B symptoms.  We will collect CMP today to update hepatic function.  Gave number to his friend to call GI to schedule appointment for further evaluation for possible endoscopy and definite colonoscopy.

## 2023-06-11 NOTE — Assessment & Plan Note (Signed)
 Uncontrolled.  Reassuringly comfortable on exam today.  Appears last fill for this medication was in January 2025.  Refilled medicine today advised him and his friend to pick up when he leaves.  Advised to follow-up in 1 month for this and issues below.

## 2023-06-12 LAB — COMPREHENSIVE METABOLIC PANEL WITH GFR
ALT: 39 [IU]/L (ref 0–44)
AST: 31 [IU]/L (ref 0–40)
Albumin: 4.2 g/dL (ref 3.9–4.9)
Alkaline Phosphatase: 87 [IU]/L (ref 44–121)
BUN/Creatinine Ratio: 13 (ref 10–24)
BUN: 11 mg/dL (ref 8–27)
Bilirubin Total: 1 mg/dL (ref 0.0–1.2)
CO2: 23 mmol/L (ref 20–29)
Calcium: 8.9 mg/dL (ref 8.6–10.2)
Chloride: 104 mmol/L (ref 96–106)
Creatinine, Ser: 0.87 mg/dL (ref 0.76–1.27)
Globulin, Total: 3.4 g/dL (ref 1.5–4.5)
Glucose: 66 mg/dL — ABNORMAL LOW (ref 70–99)
Potassium: 4.7 mmol/L (ref 3.5–5.2)
Sodium: 140 mmol/L (ref 134–144)
Total Protein: 7.6 g/dL (ref 6.0–8.5)
eGFR: 96 mL/min/{1.73_m2}

## 2023-06-12 LAB — CBC WITH DIFFERENTIAL/PLATELET
Basophils Absolute: 0.1 10*3/uL (ref 0.0–0.2)
Basos: 2 %
EOS (ABSOLUTE): 0.6 10*3/uL — ABNORMAL HIGH (ref 0.0–0.4)
Eos: 9 %
Hematocrit: 47.7 % (ref 37.5–51.0)
Hemoglobin: 14 g/dL (ref 13.0–17.7)
Immature Grans (Abs): 0 10*3/uL (ref 0.0–0.1)
Immature Granulocytes: 0 %
Lymphocytes Absolute: 2 10*3/uL (ref 0.7–3.1)
Lymphs: 29 %
MCH: 22.5 pg — ABNORMAL LOW (ref 26.6–33.0)
MCHC: 29.4 g/dL — ABNORMAL LOW (ref 31.5–35.7)
MCV: 77 fL — ABNORMAL LOW (ref 79–97)
Monocytes Absolute: 0.7 10*3/uL (ref 0.1–0.9)
Monocytes: 9 %
Neutrophils Absolute: 3.6 10*3/uL (ref 1.4–7.0)
Neutrophils: 51 %
Platelets: 276 10*3/uL (ref 150–450)
RBC: 6.22 x10E6/uL — ABNORMAL HIGH (ref 4.14–5.80)
RDW: 14.6 % (ref 11.6–15.4)
WBC: 7.1 10*3/uL (ref 3.4–10.8)

## 2023-06-12 LAB — LIPID PANEL
Chol/HDL Ratio: 3.7 ratio (ref 0.0–5.0)
Cholesterol, Total: 161 mg/dL (ref 100–199)
HDL: 43 mg/dL
LDL Chol Calc (NIH): 99 mg/dL (ref 0–99)
Triglycerides: 105 mg/dL (ref 0–149)
VLDL Cholesterol Cal: 19 mg/dL (ref 5–40)

## 2023-06-12 LAB — TSH RFX ON ABNORMAL TO FREE T4: TSH: 1.47 u[IU]/mL (ref 0.450–4.500)

## 2023-06-12 LAB — VITAMIN D 25 HYDROXY (VIT D DEFICIENCY, FRACTURES): Vit D, 25-Hydroxy: 18.2 ng/mL — ABNORMAL LOW (ref 30.0–100.0)

## 2023-06-12 LAB — VITAMIN B12: Vitamin B-12: 1938 pg/mL — ABNORMAL HIGH (ref 232–1245)

## 2023-06-15 ENCOUNTER — Ambulatory Visit: Payer: Self-pay | Admitting: Family Medicine

## 2023-06-15 NOTE — Progress Notes (Signed)
 Vitamin D level low. Daily 800 international units sent to pharmacy. This will need to be rechecked in 3-4 months. CBC with persistent microcytosis though reassuringly without anemia. He is going to get scheduled for scope with GI. Consider iron studies and Hgb electrophoresis at next visit with PCP. LFTs reassuringly normal in setting of epigastric pain. Vitamin B12 elevated though unsure if he is on supplementation; advised to stop supplementation for now if he is taking it and increasing dietary sources such as meat and diary products. Lipid panel and TSH WNL.  Letter sent to patient describing above. His friend who accompanied him to last appointment serves as interpreter/translator.

## 2023-06-26 ENCOUNTER — Ambulatory Visit (INDEPENDENT_AMBULATORY_CARE_PROVIDER_SITE_OTHER): Payer: Self-pay

## 2023-06-26 ENCOUNTER — Ambulatory Visit (HOSPITAL_COMMUNITY)
Admission: EM | Admit: 2023-06-26 | Discharge: 2023-06-26 | Disposition: A | Discharge status: Home / Self Care | Payer: Self-pay | Attending: Emergency Medicine | Admitting: Emergency Medicine

## 2023-06-26 ENCOUNTER — Encounter (HOSPITAL_COMMUNITY): Payer: Self-pay | Admitting: Emergency Medicine

## 2023-06-26 DIAGNOSIS — R051 Acute cough: Secondary | ICD-10-CM

## 2023-06-26 DIAGNOSIS — J069 Acute upper respiratory infection, unspecified: Secondary | ICD-10-CM

## 2023-06-26 HISTORY — DX: Essential (primary) hypertension: I10

## 2023-06-26 HISTORY — DX: Pure hypercholesterolemia, unspecified: E78.00

## 2023-06-26 HISTORY — DX: Gastro-esophageal reflux disease without esophagitis: K21.9

## 2023-06-26 MED ORDER — AMOXICILLIN-POT CLAVULANATE 875-125 MG PO TABS: 1.0000 | ORAL_TABLET | Freq: Two times a day (BID) | ORAL | 0 refills | Status: AC

## 2023-06-26 MED ORDER — PROMETHAZINE-DM 6.25-15 MG/5ML PO SYRP: 5.0000 mL | ORAL_SOLUTION | Freq: Four times a day (QID) | ORAL | 0 refills | Status: AC | PRN

## 2023-06-26 NOTE — ED Triage Notes (Signed)
 Male visitor with patient is translating.   Pt c/o cough, sore throat, and headache that started week ago. Took OTC flu/cold medication.

## 2023-06-26 NOTE — ED Provider Notes (Signed)
 [facility]    CSN: [id number] Arrival date & time: 06/26/23  1716      History   Chief Complaint Chief Complaint  Patient presents with   Cough   Headache    HPI Jonathan Prince is a 63 y.o. male.   Patient presents to clinic with his son, who provides translation.  Patient has had a cough, sore throat and a headache for the past week.  Has had a cough for a while, but is recently gotten worse it has been keeping him up at night.  Cough is nonproductive.  Has not had any wheezing or shortness of breath.  Denies sore throat, rhinorrhea or congestion.  Will have central chest pain with coughing.  Did try over-the-counter medication without any improvement.  Also mentions some left elbow pain after a fall 5 days ago.  Full range of motion.  Ongoing right knee pain.  Ambulatory.  The history is provided by the patient and a caregiver. The history is limited by a language barrier. A language interpreter was used.  Cough Headache   Past Medical History:  Diagnosis Date   Acid reflux    High cholesterol    Hypertension     Patient Active Problem List   Diagnosis Date Noted   Right foot pain 11/27/2022   Hyperlipidemia 11/24/2022   Epigastric pain 11/20/2022   Hypertension 10/23/2019   Healthcare maintenance 10/23/2019   Contact dermatitis and eczema 10/23/2019    History reviewed. No pertinent surgical history.     Home Medications    Prior to Admission medications   Medication Sig Start Date End Date Taking? Authorizing Provider  amoxicillin-clavulanate (AUGMENTIN) 875-125 MG tablet Take 1 tablet by mouth every 12 (twelve) hours. 06/26/23  Yes Aris Even  N, FNP  promethazine-dextromethorphan (PROMETHAZINE-DM) 6.25-15 MG/5ML syrup Take 5 mLs by mouth 4 (four) times daily as needed for cough. 06/26/23  Yes Crystian Frith  N, FNP  atorvastatin  (LIPITOR) 40 MG tablet Take 1 tablet (40 mg total) by mouth daily. 06/11/23   Dema Filler, MD   olmesartan -hydrochlorothiazide  (BENICAR  HCT) 20-12.5 MG tablet Take 1 tablet by mouth daily. 06/11/23   Dema Filler, MD  pantoprazole  (PROTONIX ) 40 MG tablet Take 1 tablet (40 mg total) by mouth daily. 06/11/23   Dema Filler, MD  hydrochlorothiazide  (HYDRODIURIL ) 25 MG tablet Take 1 tablet (25 mg total) by mouth daily. 06/23/18 05/10/19  Debbra Fairy, PA-C    Family History No family history on file.  Social History Social History   Tobacco Use   Smoking status: Never   Smokeless tobacco: Never  Substance Use Topics   Alcohol use: Never   Drug use: Never     Allergies   Patient has no known allergies.   Review of Systems Review of Systems  Per HPI  Physical Exam Triage Vital Signs ED Triage Vitals  Encounter Vitals Group     BP 06/26/23 1812 137/78     Systolic BP Percentile --      Diastolic BP Percentile --      Pulse Rate 06/26/23 1812 72     Resp 06/26/23 1812 18     Temp 06/26/23 1812 (!) 97.5 F (36.4 C)     Temp Source 06/26/23 1812 Oral     SpO2 06/26/23 1812 94 %     Weight --      Height --      Head Circumference --      Peak Flow --  Pain Score 06/26/23 1809 8     Pain Loc --      Pain Education --      Exclude from Growth Chart --    No data found.  Updated Vital Signs BP 137/78 (BP Location: Right Arm)   Pulse 72   Temp (!) 97.5 F (36.4 C) (Oral)   Resp 18   SpO2 94%   Visual Acuity Right Eye Distance:   Left Eye Distance:   Bilateral Distance:    Right Eye Near:   Left Eye Near:    Bilateral Near:     Physical Exam Vitals and nursing note reviewed.  Constitutional:      Appearance: Normal appearance.  HENT:     Head: Normocephalic and atraumatic.     Right Ear: External ear normal.     Left Ear: External ear normal.     Nose: Nose normal.     Mouth/Throat:     Mouth: Mucous membranes are moist.  Eyes:     General: No scleral icterus.    Conjunctiva/sclera: Conjunctivae normal.  Cardiovascular:     Rate and Rhythm:  Normal rate and regular rhythm.     Heart sounds: Normal heart sounds. No murmur heard. Pulmonary:     Effort: Pulmonary effort is normal. No respiratory distress.     Breath sounds: Normal breath sounds. No wheezing.  Skin:    General: Skin is warm and dry.  Neurological:     General: No focal deficit present.     Mental Status: He is alert.  Psychiatric:        Mood and Affect: Mood normal.      UC Treatments / Results  Labs (all labs ordered are listed, but only abnormal results are displayed) Labs Reviewed - No data to display  EKG   Radiology No results found.  Procedures Procedures (including critical care time)  Medications Ordered in UC Medications - No data to display  Initial Impression / Assessment and Plan / UC Course  I have reviewed the triage vital signs and the nursing notes.  Pertinent labs & imaging results that were available during my care of the patient were reviewed by me and considered in my medical decision making (see chart for details).  Vitals in triage reviewed, patient is hemodynamically stable.  Lungs are vesicular, heart with regular rate and rhythm.  Chest x-ray my interpretation does not show obvious infiltrate.  Due to symptoms induration will cover with Augmentin for upper respiratory tract infection such as bacterial sinusitis.  Cough management discussed.  Plan of care, follow-up care return precautions given, no questions at this time.     Final Clinical Impressions(s) / UC Diagnoses   Final diagnoses:  Acute cough  Upper respiratory tract infection, unspecified type     Discharge Instructions      I did not see any obvious pneumonia on his chest x-ray.  Due to his prolonged duration of symptoms we are starting him on antibiotics.  Take the Augmentin every 12 hours with food.  Use the cough medicine as needed, do not drink alcohol or drive on this medication as it may cause drowsiness or sedation.  Ensure you staying  well-hydrated with at least 64 ounces of water.  Over-the-counter Mucinex can help loosen up secretions.  You can alternate between Tylenol and ibuprofen  for any fever or bodyaches.  Symptoms should improve with medications, if no improvement or any changes follow-up with his primary care provider or return to  clinic.     ED Prescriptions     Medication Sig Dispense Auth. Provider   promethazine-dextromethorphan (PROMETHAZINE-DM) 6.25-15 MG/5ML syrup Take 5 mLs by mouth 4 (four) times daily as needed for cough. 118 mL Harlow Lighter, Amit Leece  N, FNP   amoxicillin-clavulanate (AUGMENTIN) 875-125 MG tablet Take 1 tablet by mouth every 12 (twelve) hours. 14 tablet Harlow Lighter, Joshau Code  N, FNP      PDMP not reviewed this encounter.   Harlow Lighter, Synai Prettyman  N, FNP 06/26/23 [phone]

## 2023-06-26 NOTE — Discharge Instructions (Signed)
 I did not see any obvious pneumonia on his chest x-ray.  Due to his prolonged duration of symptoms we are starting him on antibiotics.  Take the Augmentin every 12 hours with food.  Use the cough medicine as needed, do not drink alcohol or drive on this medication as it may cause drowsiness or sedation.  Ensure you staying well-hydrated with at least 64 ounces of water.  Over-the-counter Mucinex can help loosen up secretions.  You can alternate between Tylenol and ibuprofen  for any fever or bodyaches.  Symptoms should improve with medications, if no improvement or any changes follow-up with his primary care provider or return to clinic.

## 2023-08-02 ENCOUNTER — Other Ambulatory Visit: Payer: Self-pay | Admitting: Family Medicine

## 2023-08-02 DIAGNOSIS — R1013 Epigastric pain: Secondary | ICD-10-CM
